# Patient Record
Sex: Female | Born: 1970
Health system: Southern US, Community
[De-identification: ages and names within clinical notes are randomized; demographics above are authoritative.]

## PROBLEM LIST (undated history)

## (undated) DIAGNOSIS — E785 Hyperlipidemia, unspecified: Secondary | ICD-10-CM

## (undated) DIAGNOSIS — N189 Chronic kidney disease, unspecified: Secondary | ICD-10-CM

## (undated) DIAGNOSIS — K219 Gastro-esophageal reflux disease without esophagitis: Secondary | ICD-10-CM

## (undated) DIAGNOSIS — Z9889 Other specified postprocedural states: Secondary | ICD-10-CM

## (undated) HISTORY — PX: KNEE ARTHROSCOPY: SUR90

## (undated) HISTORY — PX: KIDNEY STONE SURGERY: SHX686

## (undated) HISTORY — PX: CHOLECYSTECTOMY: SHX55

## (undated) HISTORY — PX: WRIST SURGERY: SHX841

---

## 2009-01-28 ENCOUNTER — Other Ambulatory Visit: Admission: RE | Admit: 2009-01-28 | Discharge: 2009-01-28 | Payer: Self-pay | Admitting: Obstetrics and Gynecology

## 2009-04-16 ENCOUNTER — Ambulatory Visit (HOSPITAL_COMMUNITY): Admission: RE | Admit: 2009-04-16 | Discharge: 2009-04-16 | Payer: Self-pay | Admitting: Obstetrics and Gynecology

## 2010-04-19 LAB — URINALYSIS, ROUTINE W REFLEX MICROSCOPIC
Bilirubin Urine: NEGATIVE
Ketones, ur: NEGATIVE mg/dL
Nitrite: NEGATIVE
Protein, ur: NEGATIVE mg/dL
Urobilinogen, UA: 0.2 mg/dL (ref 0.0–1.0)
pH: 5.5 (ref 5.0–8.0)

## 2010-04-19 LAB — CBC
MCHC: 33.3 g/dL (ref 30.0–36.0)
MCV: 83.8 fL (ref 78.0–100.0)
Platelets: 439 10*3/uL — ABNORMAL HIGH (ref 150–400)
RDW: 14.8 % (ref 11.5–15.5)

## 2010-04-19 LAB — BASIC METABOLIC PANEL
BUN: 7 mg/dL (ref 6–23)
CO2: 26 mEq/L (ref 19–32)
Chloride: 103 mEq/L (ref 96–112)
Creatinine, Ser: 0.72 mg/dL (ref 0.4–1.2)

## 2010-04-19 LAB — PREGNANCY, URINE: Preg Test, Ur: NEGATIVE

## 2010-11-16 ENCOUNTER — Other Ambulatory Visit: Payer: Self-pay | Admitting: Obstetrics and Gynecology

## 2010-11-16 ENCOUNTER — Other Ambulatory Visit (HOSPITAL_COMMUNITY)
Admission: RE | Admit: 2010-11-16 | Discharge: 2010-11-16 | Disposition: A | Discharge status: Home / Self Care | Payer: Federal, State, Local not specified - PPO | Source: Ambulatory Visit | Attending: Obstetrics and Gynecology | Admitting: Obstetrics and Gynecology

## 2010-11-16 DIAGNOSIS — Z01419 Encounter for gynecological examination (general) (routine) without abnormal findings: Secondary | ICD-10-CM | POA: Insufficient documentation

## 2010-11-18 ENCOUNTER — Other Ambulatory Visit: Payer: Self-pay | Admitting: Obstetrics and Gynecology

## 2010-11-18 DIAGNOSIS — Z1231 Encounter for screening mammogram for malignant neoplasm of breast: Secondary | ICD-10-CM

## 2010-12-10 ENCOUNTER — Ambulatory Visit: Payer: Self-pay

## 2010-12-29 ENCOUNTER — Ambulatory Visit
Admission: RE | Admit: 2010-12-29 | Discharge: 2010-12-29 | Disposition: A | Discharge status: Home / Self Care | Payer: Federal, State, Local not specified - PPO | Source: Ambulatory Visit | Attending: Obstetrics and Gynecology | Admitting: Obstetrics and Gynecology

## 2010-12-29 DIAGNOSIS — Z1231 Encounter for screening mammogram for malignant neoplasm of breast: Secondary | ICD-10-CM

## 2011-02-17 ENCOUNTER — Other Ambulatory Visit: Payer: Self-pay | Admitting: Obstetrics and Gynecology

## 2011-04-13 ENCOUNTER — Encounter (HOSPITAL_COMMUNITY): Payer: Self-pay | Admitting: Pharmacist

## 2011-04-14 ENCOUNTER — Encounter (HOSPITAL_COMMUNITY)
Admission: RE | Admit: 2011-04-14 | Discharge: 2011-04-14 | Disposition: A | Discharge status: Home / Self Care | Payer: Federal, State, Local not specified - PPO | Source: Ambulatory Visit | Attending: Obstetrics and Gynecology | Admitting: Obstetrics and Gynecology

## 2011-04-14 ENCOUNTER — Encounter (HOSPITAL_COMMUNITY): Payer: Self-pay

## 2011-04-14 HISTORY — DX: Chronic kidney disease, unspecified: N18.9

## 2011-04-14 HISTORY — DX: Gastro-esophageal reflux disease without esophagitis: K21.9

## 2011-04-14 HISTORY — DX: Hyperlipidemia, unspecified: E78.5

## 2011-04-14 LAB — CBC
Hemoglobin: 14.3 g/dL (ref 12.0–15.0)
MCH: 27.8 pg (ref 26.0–34.0)
RBC: 5.15 MIL/uL — ABNORMAL HIGH (ref 3.87–5.11)

## 2011-04-14 LAB — SURGICAL PCR SCREEN: MRSA, PCR: NEGATIVE

## 2011-04-14 NOTE — Patient Instructions (Addendum)
YOUR PROCEDURE IS SCHEDULED ON:04/21/11  ENTER THROUGH THE MAIN ENTRANCE OF [facility] AT:0930 am  USE DESK PHONE AND DIAL [phone] TO INFORM us OF YOUR ARRIVAL  CALL [phone] IF YOU HAVE ANY QUESTIONS OR PROBLEMS PRIOR TO YOUR ARRIVAL.  REMEMBER: DO NOT EAT OR DRINK AFTER MIDNIGHT : Wed.  SPECIAL INSTRUCTIONS:   YOU MAY BRUSH YOUR TEETH THE MORNING OF SURGERY   TAKE THESE MEDICINES THE DAY OF SURGERY WITH SIP OF WATER:   DO NOT WEAR JEWELRY, EYE MAKEUP, LIPSTICK OR DARK FINGERNAIL POLISH DO NOT WEAR LOTIONS  DO NOT SHAVE FOR 48 HOURS PRIOR TO SURGERY  YOU WILL NOT BE ALLOWED TO DRIVE YOURSELF HOME.  NAME OF DRIVER:Carlos- [phone]

## 2011-04-20 ENCOUNTER — Other Ambulatory Visit: Payer: Self-pay | Admitting: Obstetrics and Gynecology

## 2011-04-21 ENCOUNTER — Encounter (HOSPITAL_COMMUNITY): Payer: Self-pay | Admitting: *Deleted

## 2011-04-21 ENCOUNTER — Ambulatory Visit (HOSPITAL_COMMUNITY)
Admission: RE | Admit: 2011-04-21 | Discharge: 2011-04-21 | Disposition: A | Discharge status: Home / Self Care | Payer: Federal, State, Local not specified - PPO | Source: Ambulatory Visit | Attending: Obstetrics and Gynecology | Admitting: Obstetrics and Gynecology

## 2011-04-21 ENCOUNTER — Encounter (HOSPITAL_COMMUNITY): Payer: Self-pay | Admitting: Anesthesiology

## 2011-04-21 ENCOUNTER — Encounter (HOSPITAL_COMMUNITY)
Admission: RE | Disposition: A | Discharge status: Home / Self Care | Payer: Self-pay | Source: Ambulatory Visit | Attending: Obstetrics and Gynecology

## 2011-04-21 ENCOUNTER — Ambulatory Visit (HOSPITAL_COMMUNITY): Payer: Federal, State, Local not specified - PPO | Admitting: Anesthesiology

## 2011-04-21 DIAGNOSIS — Z302 Encounter for sterilization: Secondary | ICD-10-CM | POA: Insufficient documentation

## 2011-04-21 DIAGNOSIS — Z01818 Encounter for other preprocedural examination: Secondary | ICD-10-CM | POA: Insufficient documentation

## 2011-04-21 DIAGNOSIS — Z9851 Tubal ligation status: Secondary | ICD-10-CM

## 2011-04-21 DIAGNOSIS — Z01812 Encounter for preprocedural laboratory examination: Secondary | ICD-10-CM | POA: Insufficient documentation

## 2011-04-21 HISTORY — PX: LAPAROSCOPIC TUBAL LIGATION: SHX1937

## 2011-04-21 HISTORY — DX: Other specified postprocedural states: Z98.890

## 2011-04-21 SURGERY — LIGATION, FALLOPIAN TUBE, LAPAROSCOPIC
Anesthesia: General | Site: Abdomen | Laterality: Bilateral | Wound class: Clean

## 2011-04-21 MED ORDER — LIDOCAINE HCL (CARDIAC) 20 MG/ML IV SOLN
INTRAVENOUS | Status: AC
  Filled 2011-04-21: qty 5

## 2011-04-21 MED ORDER — DEXAMETHASONE SODIUM PHOSPHATE 4 MG/ML IJ SOLN
INTRAMUSCULAR | Status: DC | PRN
  Administered 2011-04-21: 10 mg via INTRAVENOUS

## 2011-04-21 MED ORDER — METOCLOPRAMIDE HCL 5 MG/ML IJ SOLN
INTRAMUSCULAR | Status: AC
  Administered 2011-04-21: 10 mg via INTRAVENOUS
  Filled 2011-04-21: qty 2

## 2011-04-21 MED ORDER — CIPROFLOXACIN IN D5W 400 MG/200ML IV SOLN
INTRAVENOUS | Status: DC | PRN
  Administered 2011-04-21: 400 mg via INTRAVENOUS

## 2011-04-21 MED ORDER — LACTATED RINGERS IV SOLN
INTRAVENOUS | Status: DC
  Administered 2011-04-21: 12:00:00 via INTRAVENOUS
  Administered 2011-04-21: 125 mL/h via INTRAVENOUS
  Administered 2011-04-21: 12:00:00 via INTRAVENOUS

## 2011-04-21 MED ORDER — BUPIVACAINE HCL (PF) 0.25 % IJ SOLN
INTRAMUSCULAR | Status: DC | PRN
  Administered 2011-04-21: 10 mL
  Administered 2011-04-21: 12 mL

## 2011-04-21 MED ORDER — SILVER NITRATE-POT NITRATE 75-25 % EX MISC
CUTANEOUS | Status: DC | PRN
  Administered 2011-04-21: 1

## 2011-04-21 MED ORDER — ONDANSETRON HCL 4 MG/2ML IJ SOLN
INTRAMUSCULAR | Status: DC | PRN
  Administered 2011-04-21: 4 mg via INTRAVENOUS

## 2011-04-21 MED ORDER — NEOSTIGMINE METHYLSULFATE 1 MG/ML IJ SOLN
INTRAMUSCULAR | Status: AC
  Filled 2011-04-21: qty 10

## 2011-04-21 MED ORDER — PROPOFOL 10 MG/ML IV EMUL
INTRAVENOUS | Status: DC | PRN
  Administered 2011-04-21: 150 mg via INTRAVENOUS

## 2011-04-21 MED ORDER — FENTANYL CITRATE 0.05 MG/ML IJ SOLN
INTRAMUSCULAR | Status: AC
  Filled 2011-04-21: qty 5

## 2011-04-21 MED ORDER — HYDROCODONE-ACETAMINOPHEN 5-500 MG PO TABS: 1.0000 | ORAL_TABLET | Freq: Four times a day (QID) | ORAL | Status: AC | PRN

## 2011-04-21 MED ORDER — FENTANYL CITRATE 0.05 MG/ML IJ SOLN: 25.0000 ug | INTRAMUSCULAR | Status: DC | PRN

## 2011-04-21 MED ORDER — ROCURONIUM BROMIDE 50 MG/5ML IV SOLN
INTRAVENOUS | Status: AC
  Filled 2011-04-21: qty 1

## 2011-04-21 MED ORDER — PROPOFOL 10 MG/ML IV EMUL
INTRAVENOUS | Status: AC
  Filled 2011-04-21: qty 20

## 2011-04-21 MED ORDER — GLYCOPYRROLATE 0.2 MG/ML IJ SOLN
INTRAMUSCULAR | Status: AC
  Filled 2011-04-21: qty 1

## 2011-04-21 MED ORDER — MUPIROCIN 2 % EX OINT
TOPICAL_OINTMENT | Freq: Two times a day (BID) | CUTANEOUS | Status: DC
  Administered 2011-04-21: 1 via NASAL

## 2011-04-21 MED ORDER — LIDOCAINE HCL (CARDIAC) 20 MG/ML IV SOLN
INTRAVENOUS | Status: DC | PRN
  Administered 2011-04-21: 80 mg via INTRAVENOUS

## 2011-04-21 MED ORDER — MUPIROCIN 2 % EX OINT
TOPICAL_OINTMENT | CUTANEOUS | Status: AC
  Administered 2011-04-21: 1 via NASAL
  Filled 2011-04-21: qty 22

## 2011-04-21 MED ORDER — CIPROFLOXACIN IN D5W 400 MG/200ML IV SOLN
400.0000 mg | INTRAVENOUS | Status: DC
  Filled 2011-04-21: qty 200

## 2011-04-21 MED ORDER — MIDAZOLAM HCL 5 MG/5ML IJ SOLN
INTRAMUSCULAR | Status: DC | PRN
  Administered 2011-04-21: 2 mg via INTRAVENOUS

## 2011-04-21 MED ORDER — KETOROLAC TROMETHAMINE 30 MG/ML IJ SOLN
INTRAMUSCULAR | Status: DC | PRN
  Administered 2011-04-21: 60 mg via INTRAVENOUS

## 2011-04-21 MED ORDER — MEPERIDINE HCL 25 MG/ML IJ SOLN: 6.2500 mg | INTRAMUSCULAR | Status: DC | PRN

## 2011-04-21 MED ORDER — NEOSTIGMINE METHYLSULFATE 1 MG/ML IJ SOLN
INTRAMUSCULAR | Status: DC | PRN
  Administered 2011-04-21: 2 mg via INTRAVENOUS

## 2011-04-21 MED ORDER — ONDANSETRON HCL 4 MG/2ML IJ SOLN
INTRAMUSCULAR | Status: AC
  Filled 2011-04-21: qty 2

## 2011-04-21 MED ORDER — CLINDAMYCIN PHOSPHATE 900 MG/50ML IV SOLN
900.0000 mg | INTRAVENOUS | Status: DC
  Filled 2011-04-21: qty 50

## 2011-04-21 MED ORDER — FENTANYL CITRATE 0.05 MG/ML IJ SOLN
INTRAMUSCULAR | Status: DC | PRN
  Administered 2011-04-21 (×3): 50 ug via INTRAVENOUS

## 2011-04-21 MED ORDER — MIDAZOLAM HCL 2 MG/2ML IJ SOLN
INTRAMUSCULAR | Status: AC
  Filled 2011-04-21: qty 2

## 2011-04-21 MED ORDER — KETOROLAC TROMETHAMINE 60 MG/2ML IM SOLN
INTRAMUSCULAR | Status: AC
  Filled 2011-04-21: qty 2

## 2011-04-21 MED ORDER — CLINDAMYCIN PHOSPHATE 600 MG/50ML IV SOLN
INTRAVENOUS | Status: DC | PRN
  Administered 2011-04-21: 900 mg via INTRAVENOUS

## 2011-04-21 MED ORDER — DEXAMETHASONE SODIUM PHOSPHATE 10 MG/ML IJ SOLN
INTRAMUSCULAR | Status: AC
  Filled 2011-04-21: qty 1

## 2011-04-21 MED ORDER — ROCURONIUM BROMIDE 100 MG/10ML IV SOLN
INTRAVENOUS | Status: DC | PRN
  Administered 2011-04-21: 20 mg via INTRAVENOUS
  Administered 2011-04-21: 5 mg via INTRAVENOUS

## 2011-04-21 MED ORDER — BUPIVACAINE HCL (PF) 0.25 % IJ SOLN
INTRAMUSCULAR | Status: AC
  Filled 2011-04-21: qty 30

## 2011-04-21 MED ORDER — GLYCOPYRROLATE 0.2 MG/ML IJ SOLN
INTRAMUSCULAR | Status: DC | PRN
  Administered 2011-04-21: 0.4 mg via INTRAVENOUS

## 2011-04-21 MED ORDER — METOCLOPRAMIDE HCL 5 MG/ML IJ SOLN
10.0000 mg | Freq: Once | INTRAMUSCULAR | Status: AC | PRN
  Administered 2011-04-21: 10 mg via INTRAVENOUS

## 2011-04-21 MED ORDER — IBUPROFEN 600 MG PO TABS: 600.0000 mg | ORAL_TABLET | Freq: Four times a day (QID) | ORAL | Status: AC | PRN

## 2011-04-21 SURGICAL SUPPLY — 25 items
APPLICATOR COTTON TIP 6IN STRL (MISCELLANEOUS) ×2 IMPLANT
CATH ROBINSON RED A/P 16FR (CATHETERS) ×2 IMPLANT
CLIP FILSHIE TUBAL LIGA STRL (Clip) ×2 IMPLANT
CLOTH BEACON ORANGE TIMEOUT ST (SAFETY) ×2 IMPLANT
DERMABOND ADVANCED (GAUZE/BANDAGES/DRESSINGS) ×1
DERMABOND ADVANCED .7 DNX12 (GAUZE/BANDAGES/DRESSINGS) ×1 IMPLANT
GLOVE BIOGEL M 6.5 STRL (GLOVE) ×4 IMPLANT
GLOVE BIOGEL PI IND STRL 6 (GLOVE) ×1 IMPLANT
GLOVE BIOGEL PI IND STRL 6.5 (GLOVE) ×2 IMPLANT
GLOVE BIOGEL PI INDICATOR 6 (GLOVE) ×1
GLOVE BIOGEL PI INDICATOR 6.5 (GLOVE) ×2
GLOVE ECLIPSE 7.0 STRL STRAW (GLOVE) ×2 IMPLANT
GLOVE INDICATOR 7.0 STRL GRN (GLOVE) ×2 IMPLANT
GLOVE INDICATOR 7.5 STRL GRN (GLOVE) ×2 IMPLANT
GLOVE NEODERM STER SZ 7 (GLOVE) ×2 IMPLANT
GLOVE SURG SS PI 6.0 STRL IVOR (GLOVE) ×2 IMPLANT
GOWN PREVENTION PLUS LG XLONG (DISPOSABLE) ×6 IMPLANT
GOWN PREVENTION PLUS XLARGE (GOWN DISPOSABLE) ×2 IMPLANT
PACK LAPAROSCOPY BASIN (CUSTOM PROCEDURE TRAY) ×2 IMPLANT
SUT VICRYL 0 UR6 27IN ABS (SUTURE) ×2 IMPLANT
SUT VICRYL 4-0 PS2 18IN ABS (SUTURE) ×2 IMPLANT
TOWEL OR 17X24 6PK STRL BLUE (TOWEL DISPOSABLE) ×4 IMPLANT
TROCAR XCEL NON-BLD 11X100MML (ENDOMECHANICALS) ×2 IMPLANT
WARMER LAPAROSCOPE (MISCELLANEOUS) ×2 IMPLANT
WATER STERILE IRR 1000ML POUR (IV SOLUTION) ×2 IMPLANT

## 2011-04-21 NOTE — Discharge Instructions (Signed)

## 2011-04-21 NOTE — Preoperative (Signed)
Beta Blockers   Reason not to administer Beta Blockers:Not Applicable 

## 2011-04-21 NOTE — Anesthesia Postprocedure Evaluation (Signed)
  Anesthesia Post-op Note  Patient: Shari Olson  Procedure(s) Performed: Procedure(s) (LRB): LAPAROSCOPIC TUBAL LIGATION (Bilateral)  Patient Location: PACU  Anesthesia Type: General  Level of Consciousness: awake, alert  and oriented  Airway and Oxygen Therapy: Patient Spontanous Breathing  Post-op Pain: none  Post-op Assessment: Post-op Vital signs reviewed, Patient's Cardiovascular Status Stable, Respiratory Function Stable, Patent Airway, No signs of Nausea or vomiting and Pain level controlled  Post-op Vital Signs: Reviewed and stable  Complications: No apparent anesthesia complications

## 2011-04-21 NOTE — Anesthesia Preprocedure Evaluation (Signed)
Anesthesia Evaluation  Patient identified by MRN, date of birth, ID band Patient awake    Reviewed: Allergy & Precautions, H&P , NPO status , Patient's Chart, lab work & pertinent test results  Airway Mallampati: III TM Distance: >3 FB Neck ROM: full    Dental No notable dental hx. (+) Teeth Intact   Pulmonary neg pulmonary ROS,  breath sounds clear to auscultation  Pulmonary exam normal       Cardiovascular negative cardio ROS  Rhythm:regular Rate:Normal     Neuro/Psych negative neurological ROS  negative psych ROS   GI/Hepatic Neg liver ROS, GERD-  Medicated,  Endo/Other  negative endocrine ROS  Renal/GU negative Renal ROS  negative genitourinary   Musculoskeletal   Abdominal Normal abdominal exam  (+)   Peds  Hematology negative hematology ROS (+)   Anesthesia Other Findings   Reproductive/Obstetrics negative OB ROS                           Anesthesia Physical Anesthesia Plan  ASA: II  Anesthesia Plan: General ETT   Post-op Pain Management:    Induction:   Airway Management Planned:   Additional Equipment:   Intra-op Plan:   Post-operative Plan:   Informed Consent: I have reviewed the patients History and Physical, chart, labs and discussed the procedure including the risks, benefits and alternatives for the proposed anesthesia with the patient or authorized representative who has indicated his/her understanding and acceptance.   Dental Advisory Given  Plan Discussed with: Anesthesiologist, CRNA and Surgeon  Anesthesia Plan Comments:         Anesthesia Quick Evaluation

## 2011-04-21 NOTE — Transfer of Care (Signed)
Immediate Anesthesia Transfer of Care Note  Patient: Shari Olson  Procedure(s) Performed: Procedure(s) (LRB): LAPAROSCOPIC TUBAL LIGATION (Bilateral)  Patient Location: PACU  Anesthesia Type: General  Level of Consciousness: awake, alert , oriented and patient cooperative  Airway & Oxygen Therapy: Patient Spontanous Breathing and Patient connected to nasal cannula oxygen  Post-op Assessment: Report given to PACU RN and Post -op Vital signs reviewed and stable  Post vital signs: Reviewed and stable  Complications: No apparent anesthesia complications

## 2011-04-21 NOTE — H&P (Signed)
41 yo G2P2 presents for laparoscopic bilateral tubal ligation. She desires permanent sterilization.   PMH ovarian cyst Vitamin D deficiency Cholesterolemia  PSH cholecystectomy lipotripsy x2 Right knee tear-laser surgery Left wrist surgery D&C hysteroscopy and polypectomy   Social denies tob/ etoh or illicit drug use  Family history noncontributory GYN no std's  Allergies penicillin  Meds simvastatin zantac mvi  ROS irregular menses otherwise negative except as stated in HPI   AF VSS CV rrr Lungs Clear Abd soft nontender +BS Ext no edema  A/P patient desires permanent sterilization... Plan for laparoscopic tubal ligation with filschie clips. R/b/a of surgery discussed with the patient including infection / bleeding damage to bowel bladder and surrounding organs with the need for further surgery. Pt advised that tubal ligation is 99 % effective and has a 1 % r/o failure with possible life threatenng  Ectopic if failure occurs. Pt voiced understanding and desires to proceed with laparoscopic tubaligation with fulgaration or filschie clips.

## 2011-04-21 NOTE — Op Note (Signed)
04/21/2011  11:51 AM  PATIENT:  Shari Olson  41 y.o. female  PRE-OPERATIVE DIAGNOSIS:  BTL  POST-OPERATIVE DIAGNOSIS:  BTL  PROCEDURE:  Procedure(s) (LRB): LAPAROSCOPIC TUBAL LIGATION (Bilateral) with filschie clips   SURGEON:  Surgeon(s) and Role:    * Joshuah Minella J. Richardson Dopp, MD - Primary  PHYSICIAN ASSISTANT: None  ASSISTANTS: none   ANESTHESIA:   general  EBL: 10 cc Total I/O In: 1000 [I.V.:1000] Out: 55 [Urine:50; Blood:5]  BLOOD ADMINISTERED:none  DRAINS: none   LOCAL MEDICATIONS USED:  MARCAINE     SPECIMEN:  No Specimen  DISPOSITION OF SPECIMEN:  PATHOLOGY no specimen  COUNTS:  YES  TOURNIQUET:  * No tourniquets in log *  DICTATION: .Dragon Dictation  PLAN OF CARE: Discharge to home after PACU  PATIENT DISPOSITION:  PACU - hemodynamically stable.    Procedure. Pt was taken to the OR where she was placed under general anesthesia. She was placed in the dorsal lithotomy position. She was prepped In and out catheterized and draped in a sterile fashion. A speculum was placed in the vaginal vault and the uterine manipulator was placed without difficulty.   Attention was turned to the abdomen. The umbilicus was injected with 10 cc of 0.25% marcaine. A 10 mm incision was made with the scapel. A 10 mm trocar was placed under direct visualization.  Pneumoperitoneum was achieved with CO 2 gas. Findings paratubal cyst.. Normal ovaries. .. The fallopian tubes were identifed bilaterally and followed to the fimbriated end. Using the operative laparoscope filschie clips were placed over the fallopian tubes bilaterally. 5 cc of 0.25 % marcaine was placed over the filschie clips.    The pneumoperitoneum was released . The 10 mm trocar was removed under  Direct visualization. The fascia incision was closed with 0 vicryl. The skin was closed with 4-0 vicryl and dermabond.  The uterine manipulater was removed. Pt had slight bleeding from the tenaculum slight. Silver nitrate was applied  and hemostasis was noted.  Sponge lap and needle counts were correct times 2.    EBL 10 cc  UOP 75 cc   Delay start of Pharmacological VTE agent (>24hrs) due to surgical blood loss or risk of bleeding: not applicable

## 2011-04-23 ENCOUNTER — Encounter (HOSPITAL_COMMUNITY): Payer: Self-pay | Admitting: Obstetrics and Gynecology

## 2011-11-30 ENCOUNTER — Other Ambulatory Visit: Payer: Self-pay | Admitting: Obstetrics and Gynecology

## 2011-11-30 ENCOUNTER — Other Ambulatory Visit (HOSPITAL_COMMUNITY)
Admission: RE | Admit: 2011-11-30 | Discharge: 2011-11-30 | Disposition: A | Discharge status: Home / Self Care | Payer: BC Managed Care – PPO | Source: Ambulatory Visit | Attending: Obstetrics and Gynecology | Admitting: Obstetrics and Gynecology

## 2011-11-30 DIAGNOSIS — Z01419 Encounter for gynecological examination (general) (routine) without abnormal findings: Secondary | ICD-10-CM | POA: Insufficient documentation

## 2013-03-20 ENCOUNTER — Other Ambulatory Visit: Payer: Self-pay | Admitting: Family Medicine

## 2013-03-20 DIAGNOSIS — R7989 Other specified abnormal findings of blood chemistry: Secondary | ICD-10-CM

## 2013-03-20 DIAGNOSIS — R945 Abnormal results of liver function studies: Principal | ICD-10-CM

## 2013-03-22 ENCOUNTER — Other Ambulatory Visit: Payer: Self-pay | Admitting: Obstetrics and Gynecology

## 2013-03-22 ENCOUNTER — Other Ambulatory Visit (HOSPITAL_COMMUNITY)
Admission: RE | Admit: 2013-03-22 | Discharge: 2013-03-22 | Disposition: A | Discharge status: Home / Self Care | Payer: BC Managed Care – PPO | Source: Ambulatory Visit | Attending: Obstetrics and Gynecology | Admitting: Obstetrics and Gynecology

## 2013-03-22 ENCOUNTER — Other Ambulatory Visit: Payer: Self-pay

## 2013-03-22 DIAGNOSIS — Z1231 Encounter for screening mammogram for malignant neoplasm of breast: Secondary | ICD-10-CM

## 2013-03-22 DIAGNOSIS — Z124 Encounter for screening for malignant neoplasm of cervix: Secondary | ICD-10-CM | POA: Insufficient documentation

## 2013-03-22 DIAGNOSIS — Z1151 Encounter for screening for human papillomavirus (HPV): Secondary | ICD-10-CM | POA: Insufficient documentation

## 2013-03-29 ENCOUNTER — Ambulatory Visit
Admission: RE | Admit: 2013-03-29 | Discharge: 2013-03-29 | Disposition: A | Discharge status: Home / Self Care | Payer: BC Managed Care – PPO | Source: Ambulatory Visit | Attending: Family Medicine | Admitting: Family Medicine

## 2013-03-29 DIAGNOSIS — R945 Abnormal results of liver function studies: Principal | ICD-10-CM

## 2013-03-29 DIAGNOSIS — R7989 Other specified abnormal findings of blood chemistry: Secondary | ICD-10-CM

## 2013-04-09 ENCOUNTER — Ambulatory Visit: Payer: BC Managed Care – PPO

## 2013-04-11 ENCOUNTER — Ambulatory Visit: Payer: BC Managed Care – PPO

## 2013-04-15 ENCOUNTER — Ambulatory Visit
Admission: RE | Admit: 2013-04-15 | Discharge: 2013-04-15 | Disposition: A | Discharge status: Home / Self Care | Payer: BC Managed Care – PPO | Source: Ambulatory Visit

## 2013-04-15 DIAGNOSIS — Z1231 Encounter for screening mammogram for malignant neoplasm of breast: Secondary | ICD-10-CM

## 2014-01-08 ENCOUNTER — Other Ambulatory Visit: Payer: Self-pay | Admitting: Family Medicine

## 2014-01-08 DIAGNOSIS — R1032 Left lower quadrant pain: Secondary | ICD-10-CM

## 2014-01-10 ENCOUNTER — Ambulatory Visit
Admission: RE | Admit: 2014-01-10 | Discharge: 2014-01-10 | Disposition: A | Discharge status: Home / Self Care | Payer: Federal, State, Local not specified - PPO | Source: Ambulatory Visit | Attending: Family Medicine | Admitting: Family Medicine

## 2014-01-10 DIAGNOSIS — R1032 Left lower quadrant pain: Secondary | ICD-10-CM

## 2014-01-21 ENCOUNTER — Other Ambulatory Visit: Payer: Self-pay | Admitting: Family Medicine

## 2014-01-21 DIAGNOSIS — R1032 Left lower quadrant pain: Secondary | ICD-10-CM

## 2014-01-23 ENCOUNTER — Other Ambulatory Visit: Payer: Federal, State, Local not specified - PPO

## 2014-01-29 ENCOUNTER — Other Ambulatory Visit: Payer: Federal, State, Local not specified - PPO

## 2014-01-30 ENCOUNTER — Ambulatory Visit
Admission: RE | Admit: 2014-01-30 | Discharge: 2014-01-30 | Disposition: A | Discharge status: Home / Self Care | Payer: Federal, State, Local not specified - PPO | Source: Ambulatory Visit | Attending: Family Medicine | Admitting: Family Medicine

## 2014-01-30 DIAGNOSIS — R1032 Left lower quadrant pain: Secondary | ICD-10-CM

## 2014-01-30 MED ORDER — IOHEXOL 300 MG/ML  SOLN
100.0000 mL | Freq: Once | INTRAMUSCULAR | Status: AC | PRN
  Administered 2014-01-30: 100 mL via INTRAVENOUS

## 2014-03-24 ENCOUNTER — Other Ambulatory Visit: Payer: Self-pay | Admitting: Obstetrics and Gynecology

## 2014-03-24 ENCOUNTER — Other Ambulatory Visit (HOSPITAL_COMMUNITY)
Admission: RE | Admit: 2014-03-24 | Discharge: 2014-03-24 | Disposition: A | Discharge status: Home / Self Care | Payer: Federal, State, Local not specified - PPO | Source: Ambulatory Visit | Attending: Obstetrics and Gynecology | Admitting: Obstetrics and Gynecology

## 2014-03-24 DIAGNOSIS — Z113 Encounter for screening for infections with a predominantly sexual mode of transmission: Secondary | ICD-10-CM | POA: Diagnosis present

## 2014-03-24 DIAGNOSIS — Z01419 Encounter for gynecological examination (general) (routine) without abnormal findings: Secondary | ICD-10-CM | POA: Diagnosis present

## 2014-03-27 LAB — CYTOLOGY - PAP

## 2014-05-27 ENCOUNTER — Other Ambulatory Visit: Payer: Self-pay

## 2014-05-27 DIAGNOSIS — Z1231 Encounter for screening mammogram for malignant neoplasm of breast: Secondary | ICD-10-CM

## 2014-06-04 ENCOUNTER — Ambulatory Visit
Admission: RE | Admit: 2014-06-04 | Discharge: 2014-06-04 | Disposition: A | Discharge status: Home / Self Care | Payer: Federal, State, Local not specified - PPO | Source: Ambulatory Visit

## 2014-06-04 ENCOUNTER — Ambulatory Visit: Payer: Federal, State, Local not specified - PPO

## 2014-06-04 DIAGNOSIS — Z1231 Encounter for screening mammogram for malignant neoplasm of breast: Secondary | ICD-10-CM

## 2015-02-05 ENCOUNTER — Encounter (HOSPITAL_BASED_OUTPATIENT_CLINIC_OR_DEPARTMENT_OTHER): Payer: Self-pay | Admitting: *Deleted

## 2015-02-05 ENCOUNTER — Emergency Department (HOSPITAL_BASED_OUTPATIENT_CLINIC_OR_DEPARTMENT_OTHER)
Admission: EM | Admit: 2015-02-05 | Discharge: 2015-02-05 | Disposition: A | Discharge status: Home / Self Care | Payer: Worker's Compensation | Attending: Emergency Medicine | Admitting: Emergency Medicine

## 2015-02-05 ENCOUNTER — Emergency Department (HOSPITAL_BASED_OUTPATIENT_CLINIC_OR_DEPARTMENT_OTHER): Payer: Worker's Compensation

## 2015-02-05 DIAGNOSIS — S161XXA Strain of muscle, fascia and tendon at neck level, initial encounter: Secondary | ICD-10-CM | POA: Diagnosis not present

## 2015-02-05 DIAGNOSIS — Z79899 Other long term (current) drug therapy: Secondary | ICD-10-CM | POA: Diagnosis not present

## 2015-02-05 DIAGNOSIS — Y92410 Unspecified street and highway as the place of occurrence of the external cause: Secondary | ICD-10-CM | POA: Diagnosis not present

## 2015-02-05 DIAGNOSIS — Y9389 Activity, other specified: Secondary | ICD-10-CM | POA: Insufficient documentation

## 2015-02-05 DIAGNOSIS — Z87442 Personal history of urinary calculi: Secondary | ICD-10-CM | POA: Diagnosis not present

## 2015-02-05 DIAGNOSIS — Y998 Other external cause status: Secondary | ICD-10-CM | POA: Diagnosis not present

## 2015-02-05 DIAGNOSIS — S199XXA Unspecified injury of neck, initial encounter: Secondary | ICD-10-CM | POA: Diagnosis present

## 2015-02-05 DIAGNOSIS — N189 Chronic kidney disease, unspecified: Secondary | ICD-10-CM | POA: Insufficient documentation

## 2015-02-05 DIAGNOSIS — E785 Hyperlipidemia, unspecified: Secondary | ICD-10-CM | POA: Diagnosis not present

## 2015-02-05 DIAGNOSIS — Z8719 Personal history of other diseases of the digestive system: Secondary | ICD-10-CM | POA: Diagnosis not present

## 2015-02-05 DIAGNOSIS — Z880 Allergy status to penicillin: Secondary | ICD-10-CM | POA: Insufficient documentation

## 2015-02-05 MED ORDER — IBUPROFEN 400 MG PO TABS
600.0000 mg | ORAL_TABLET | Freq: Once | ORAL | Status: AC
  Administered 2015-02-05: 600 mg via ORAL
  Filled 2015-02-05: qty 1

## 2015-02-05 MED ORDER — CYCLOBENZAPRINE HCL 5 MG PO TABS: 5.0000 mg | ORAL_TABLET | Freq: Three times a day (TID) | ORAL | Status: AC | PRN

## 2015-02-05 MED ORDER — CYCLOBENZAPRINE HCL 10 MG PO TABS
5.0000 mg | ORAL_TABLET | Freq: Once | ORAL | Status: AC
  Administered 2015-02-05: 5 mg via ORAL
  Filled 2015-02-05: qty 1

## 2015-02-05 NOTE — ED Notes (Signed)
Unable to find pt

## 2015-02-05 NOTE — ED Notes (Signed)
BAT completed first then UDS
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
## 2015-02-05 NOTE — Discharge Instructions (Signed)
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
## 2015-03-24 ENCOUNTER — Other Ambulatory Visit (HOSPITAL_COMMUNITY)
Admission: RE | Admit: 2015-03-24 | Discharge: 2015-03-24 | Disposition: A | Discharge status: Home / Self Care | Payer: Federal, State, Local not specified - PPO | Source: Ambulatory Visit | Attending: Obstetrics and Gynecology | Admitting: Obstetrics and Gynecology

## 2015-03-24 ENCOUNTER — Other Ambulatory Visit: Payer: Self-pay | Admitting: Obstetrics and Gynecology

## 2015-03-24 DIAGNOSIS — Z01419 Encounter for gynecological examination (general) (routine) without abnormal findings: Secondary | ICD-10-CM | POA: Insufficient documentation

## 2015-03-25 LAB — CYTOLOGY - PAP

## 2015-10-27 DIAGNOSIS — E872 Acidosis: Secondary | ICD-10-CM | POA: Diagnosis not present

## 2015-10-27 DIAGNOSIS — K760 Fatty (change of) liver, not elsewhere classified: Secondary | ICD-10-CM | POA: Diagnosis not present

## 2015-10-27 DIAGNOSIS — E611 Iron deficiency: Secondary | ICD-10-CM | POA: Diagnosis not present

## 2015-11-06 DIAGNOSIS — M5430 Sciatica, unspecified side: Secondary | ICD-10-CM | POA: Diagnosis not present

## 2015-11-26 DIAGNOSIS — K080 Exfoliation of teeth due to systemic causes: Secondary | ICD-10-CM | POA: Diagnosis not present

## 2015-12-23 DIAGNOSIS — E669 Obesity, unspecified: Secondary | ICD-10-CM | POA: Diagnosis not present

## 2015-12-23 DIAGNOSIS — E7800 Pure hypercholesterolemia, unspecified: Secondary | ICD-10-CM | POA: Diagnosis not present

## 2015-12-23 DIAGNOSIS — E559 Vitamin D deficiency, unspecified: Secondary | ICD-10-CM | POA: Diagnosis not present

## 2015-12-23 DIAGNOSIS — Z0000 Encounter for general adult medical examination without abnormal findings: Secondary | ICD-10-CM | POA: Diagnosis not present

## 2015-12-23 DIAGNOSIS — R740 Nonspecific elevation of levels of transaminase and lactic acid dehydrogenase [LDH]: Secondary | ICD-10-CM | POA: Diagnosis not present

## 2016-03-17 DIAGNOSIS — F4321 Adjustment disorder with depressed mood: Secondary | ICD-10-CM | POA: Diagnosis not present

## 2016-04-05 DIAGNOSIS — E782 Mixed hyperlipidemia: Secondary | ICD-10-CM | POA: Diagnosis not present

## 2016-05-03 ENCOUNTER — Other Ambulatory Visit: Payer: Self-pay | Admitting: Obstetrics and Gynecology

## 2016-05-03 ENCOUNTER — Other Ambulatory Visit (HOSPITAL_COMMUNITY)
Admission: RE | Admit: 2016-05-03 | Discharge: 2016-05-03 | Disposition: A | Discharge status: Home / Self Care | Payer: Federal, State, Local not specified - PPO | Source: Ambulatory Visit | Attending: Obstetrics and Gynecology | Admitting: Obstetrics and Gynecology

## 2016-05-03 DIAGNOSIS — Z01419 Encounter for gynecological examination (general) (routine) without abnormal findings: Secondary | ICD-10-CM | POA: Diagnosis not present

## 2016-05-03 DIAGNOSIS — Z1151 Encounter for screening for human papillomavirus (HPV): Secondary | ICD-10-CM | POA: Diagnosis not present

## 2016-05-03 DIAGNOSIS — Z1231 Encounter for screening mammogram for malignant neoplasm of breast: Secondary | ICD-10-CM

## 2016-05-05 LAB — CYTOLOGY - PAP
Diagnosis: NEGATIVE
HPV (WINDOPATH): NOT DETECTED

## 2016-05-10 DIAGNOSIS — K760 Fatty (change of) liver, not elsewhere classified: Secondary | ICD-10-CM | POA: Diagnosis not present

## 2016-05-23 ENCOUNTER — Ambulatory Visit
Admission: RE | Admit: 2016-05-23 | Discharge: 2016-05-23 | Disposition: A | Discharge status: Home / Self Care | Payer: Federal, State, Local not specified - PPO | Source: Ambulatory Visit | Attending: Obstetrics and Gynecology | Admitting: Obstetrics and Gynecology

## 2016-05-23 DIAGNOSIS — Z1231 Encounter for screening mammogram for malignant neoplasm of breast: Secondary | ICD-10-CM

## 2016-06-02 DIAGNOSIS — K080 Exfoliation of teeth due to systemic causes: Secondary | ICD-10-CM | POA: Diagnosis not present

## 2016-12-28 DIAGNOSIS — K080 Exfoliation of teeth due to systemic causes: Secondary | ICD-10-CM | POA: Diagnosis not present

## 2017-01-04 DIAGNOSIS — Z0000 Encounter for general adult medical examination without abnormal findings: Secondary | ICD-10-CM | POA: Diagnosis not present

## 2017-01-04 DIAGNOSIS — E669 Obesity, unspecified: Secondary | ICD-10-CM | POA: Diagnosis not present

## 2017-01-04 DIAGNOSIS — E7800 Pure hypercholesterolemia, unspecified: Secondary | ICD-10-CM | POA: Diagnosis not present

## 2017-01-04 DIAGNOSIS — E559 Vitamin D deficiency, unspecified: Secondary | ICD-10-CM | POA: Diagnosis not present

## 2017-01-04 DIAGNOSIS — Z6831 Body mass index (BMI) 31.0-31.9, adult: Secondary | ICD-10-CM | POA: Diagnosis not present

## 2017-03-22 DIAGNOSIS — E559 Vitamin D deficiency, unspecified: Secondary | ICD-10-CM | POA: Diagnosis not present

## 2017-05-08 ENCOUNTER — Other Ambulatory Visit (HOSPITAL_COMMUNITY): Payer: Self-pay | Admitting: Gastroenterology

## 2017-05-08 DIAGNOSIS — K760 Fatty (change of) liver, not elsewhere classified: Secondary | ICD-10-CM | POA: Diagnosis not present

## 2017-05-08 DIAGNOSIS — Z8379 Family history of other diseases of the digestive system: Secondary | ICD-10-CM | POA: Diagnosis not present

## 2017-05-09 DIAGNOSIS — Z01419 Encounter for gynecological examination (general) (routine) without abnormal findings: Secondary | ICD-10-CM | POA: Diagnosis not present

## 2017-05-09 DIAGNOSIS — E782 Mixed hyperlipidemia: Secondary | ICD-10-CM | POA: Diagnosis not present

## 2017-05-11 ENCOUNTER — Ambulatory Visit (HOSPITAL_COMMUNITY)
Admission: RE | Admit: 2017-05-11 | Discharge: 2017-05-11 | Disposition: A | Discharge status: Home / Self Care | Payer: Federal, State, Local not specified - PPO | Source: Ambulatory Visit | Attending: Gastroenterology | Admitting: Gastroenterology

## 2017-05-11 DIAGNOSIS — K760 Fatty (change of) liver, not elsewhere classified: Secondary | ICD-10-CM | POA: Diagnosis not present

## 2017-05-11 DIAGNOSIS — K769 Liver disease, unspecified: Secondary | ICD-10-CM | POA: Insufficient documentation

## 2018-05-23 DIAGNOSIS — Z0000 Encounter for general adult medical examination without abnormal findings: Secondary | ICD-10-CM | POA: Diagnosis not present

## 2018-07-16 DIAGNOSIS — K7581 Nonalcoholic steatohepatitis (NASH): Secondary | ICD-10-CM | POA: Diagnosis not present

## 2018-07-18 ENCOUNTER — Other Ambulatory Visit: Payer: Self-pay | Admitting: Nurse Practitioner

## 2018-07-18 DIAGNOSIS — K7469 Other cirrhosis of liver: Secondary | ICD-10-CM

## 2018-07-19 DIAGNOSIS — Z01419 Encounter for gynecological examination (general) (routine) without abnormal findings: Secondary | ICD-10-CM | POA: Diagnosis not present

## 2018-07-20 ENCOUNTER — Other Ambulatory Visit: Payer: Self-pay | Admitting: Obstetrics and Gynecology

## 2018-07-20 DIAGNOSIS — Z1231 Encounter for screening mammogram for malignant neoplasm of breast: Secondary | ICD-10-CM

## 2018-07-23 ENCOUNTER — Other Ambulatory Visit: Payer: Self-pay

## 2018-07-23 ENCOUNTER — Ambulatory Visit
Admission: RE | Admit: 2018-07-23 | Discharge: 2018-07-23 | Disposition: A | Discharge status: Home / Self Care | Payer: Federal, State, Local not specified - PPO | Source: Ambulatory Visit | Attending: Obstetrics and Gynecology | Admitting: Obstetrics and Gynecology

## 2018-07-23 DIAGNOSIS — Z1231 Encounter for screening mammogram for malignant neoplasm of breast: Secondary | ICD-10-CM

## 2018-08-03 ENCOUNTER — Ambulatory Visit
Admission: RE | Admit: 2018-08-03 | Discharge: 2018-08-03 | Disposition: A | Discharge status: Home / Self Care | Payer: Federal, State, Local not specified - PPO | Source: Ambulatory Visit | Attending: Nurse Practitioner | Admitting: Nurse Practitioner

## 2018-08-03 DIAGNOSIS — K7469 Other cirrhosis of liver: Secondary | ICD-10-CM

## 2018-08-03 DIAGNOSIS — Z9049 Acquired absence of other specified parts of digestive tract: Secondary | ICD-10-CM | POA: Diagnosis not present

## 2018-08-03 DIAGNOSIS — K743 Primary biliary cirrhosis: Secondary | ICD-10-CM | POA: Diagnosis not present

## 2019-01-04 ENCOUNTER — Other Ambulatory Visit: Payer: Self-pay | Admitting: Nurse Practitioner

## 2019-01-04 DIAGNOSIS — K7581 Nonalcoholic steatohepatitis (NASH): Secondary | ICD-10-CM | POA: Diagnosis not present

## 2019-01-04 DIAGNOSIS — K7469 Other cirrhosis of liver: Secondary | ICD-10-CM

## 2019-01-04 DIAGNOSIS — R768 Other specified abnormal immunological findings in serum: Secondary | ICD-10-CM | POA: Diagnosis not present

## 2019-01-21 ENCOUNTER — Other Ambulatory Visit: Payer: Federal, State, Local not specified - PPO

## 2019-01-23 ENCOUNTER — Ambulatory Visit
Admission: RE | Admit: 2019-01-23 | Discharge: 2019-01-23 | Disposition: A | Discharge status: Home / Self Care | Payer: Federal, State, Local not specified - PPO | Source: Ambulatory Visit | Attending: Nurse Practitioner | Admitting: Nurse Practitioner

## 2019-01-23 DIAGNOSIS — K7460 Unspecified cirrhosis of liver: Secondary | ICD-10-CM | POA: Diagnosis not present

## 2019-01-23 DIAGNOSIS — K7469 Other cirrhosis of liver: Secondary | ICD-10-CM

## 2019-05-29 DIAGNOSIS — Z0000 Encounter for general adult medical examination without abnormal findings: Secondary | ICD-10-CM | POA: Diagnosis not present

## 2019-05-29 DIAGNOSIS — E611 Iron deficiency: Secondary | ICD-10-CM | POA: Diagnosis not present

## 2019-05-29 DIAGNOSIS — R7301 Impaired fasting glucose: Secondary | ICD-10-CM | POA: Diagnosis not present

## 2019-05-29 DIAGNOSIS — E7800 Pure hypercholesterolemia, unspecified: Secondary | ICD-10-CM | POA: Diagnosis not present

## 2019-05-29 DIAGNOSIS — E559 Vitamin D deficiency, unspecified: Secondary | ICD-10-CM | POA: Diagnosis not present

## 2019-07-18 ENCOUNTER — Other Ambulatory Visit: Payer: Self-pay | Admitting: Nurse Practitioner

## 2019-07-18 DIAGNOSIS — R768 Other specified abnormal immunological findings in serum: Secondary | ICD-10-CM | POA: Diagnosis not present

## 2019-07-18 DIAGNOSIS — K7581 Nonalcoholic steatohepatitis (NASH): Secondary | ICD-10-CM | POA: Diagnosis not present

## 2019-07-18 DIAGNOSIS — K7469 Other cirrhosis of liver: Secondary | ICD-10-CM

## 2019-07-23 DIAGNOSIS — Z01419 Encounter for gynecological examination (general) (routine) without abnormal findings: Secondary | ICD-10-CM | POA: Diagnosis not present

## 2019-07-24 ENCOUNTER — Other Ambulatory Visit: Payer: Self-pay

## 2019-07-24 ENCOUNTER — Ambulatory Visit
Admission: RE | Admit: 2019-07-24 | Discharge: 2019-07-24 | Disposition: A | Discharge status: Home / Self Care | Payer: Federal, State, Local not specified - PPO | Source: Ambulatory Visit | Attending: Obstetrics and Gynecology | Admitting: Obstetrics and Gynecology

## 2019-07-24 ENCOUNTER — Ambulatory Visit
Admission: RE | Admit: 2019-07-24 | Discharge: 2019-07-24 | Disposition: A | Discharge status: Home / Self Care | Payer: Federal, State, Local not specified - PPO | Source: Ambulatory Visit | Attending: Nurse Practitioner | Admitting: Nurse Practitioner

## 2019-07-24 ENCOUNTER — Other Ambulatory Visit: Payer: Self-pay | Admitting: Obstetrics and Gynecology

## 2019-07-24 DIAGNOSIS — K7469 Other cirrhosis of liver: Secondary | ICD-10-CM

## 2019-07-24 DIAGNOSIS — Z1231 Encounter for screening mammogram for malignant neoplasm of breast: Secondary | ICD-10-CM

## 2019-07-24 DIAGNOSIS — K7460 Unspecified cirrhosis of liver: Secondary | ICD-10-CM | POA: Diagnosis not present

## 2019-07-24 DIAGNOSIS — N200 Calculus of kidney: Secondary | ICD-10-CM | POA: Diagnosis not present

## 2019-07-24 DIAGNOSIS — Q600 Renal agenesis, unilateral: Secondary | ICD-10-CM | POA: Diagnosis not present

## 2019-08-19 DIAGNOSIS — R768 Other specified abnormal immunological findings in serum: Secondary | ICD-10-CM | POA: Diagnosis not present

## 2019-08-19 DIAGNOSIS — Z23 Encounter for immunization: Secondary | ICD-10-CM | POA: Diagnosis not present

## 2019-08-19 DIAGNOSIS — K7581 Nonalcoholic steatohepatitis (NASH): Secondary | ICD-10-CM | POA: Diagnosis not present

## 2019-09-06 DIAGNOSIS — R7309 Other abnormal glucose: Secondary | ICD-10-CM | POA: Diagnosis not present

## 2019-11-24 DIAGNOSIS — N201 Calculus of ureter: Secondary | ICD-10-CM | POA: Diagnosis not present

## 2019-11-24 DIAGNOSIS — R10814 Left lower quadrant abdominal tenderness: Secondary | ICD-10-CM | POA: Diagnosis not present

## 2020-01-02 DIAGNOSIS — Z23 Encounter for immunization: Secondary | ICD-10-CM | POA: Diagnosis not present

## 2020-02-02 DIAGNOSIS — R059 Cough, unspecified: Secondary | ICD-10-CM | POA: Diagnosis not present

## 2020-02-02 DIAGNOSIS — Z20828 Contact with and (suspected) exposure to other viral communicable diseases: Secondary | ICD-10-CM | POA: Diagnosis not present

## 2020-02-02 DIAGNOSIS — J069 Acute upper respiratory infection, unspecified: Secondary | ICD-10-CM | POA: Diagnosis not present

## 2020-03-16 ENCOUNTER — Other Ambulatory Visit: Payer: Self-pay | Admitting: Nurse Practitioner

## 2020-03-16 DIAGNOSIS — K7581 Nonalcoholic steatohepatitis (NASH): Secondary | ICD-10-CM | POA: Diagnosis not present

## 2020-03-27 DIAGNOSIS — R450 Nervousness: Secondary | ICD-10-CM | POA: Diagnosis not present

## 2020-03-27 DIAGNOSIS — M542 Cervicalgia: Secondary | ICD-10-CM | POA: Diagnosis not present

## 2020-03-27 DIAGNOSIS — R0789 Other chest pain: Secondary | ICD-10-CM | POA: Diagnosis not present

## 2020-03-27 DIAGNOSIS — M25512 Pain in left shoulder: Secondary | ICD-10-CM | POA: Diagnosis not present

## 2020-03-27 DIAGNOSIS — R079 Chest pain, unspecified: Secondary | ICD-10-CM | POA: Diagnosis not present

## 2020-03-27 DIAGNOSIS — Z79899 Other long term (current) drug therapy: Secondary | ICD-10-CM | POA: Diagnosis not present

## 2020-03-27 DIAGNOSIS — E785 Hyperlipidemia, unspecified: Secondary | ICD-10-CM | POA: Diagnosis not present

## 2020-03-30 DIAGNOSIS — F411 Generalized anxiety disorder: Secondary | ICD-10-CM | POA: Diagnosis not present

## 2020-03-30 DIAGNOSIS — R079 Chest pain, unspecified: Secondary | ICD-10-CM | POA: Diagnosis not present

## 2020-04-01 ENCOUNTER — Other Ambulatory Visit: Payer: Federal, State, Local not specified - PPO

## 2020-04-01 DIAGNOSIS — K7400 Hepatic fibrosis, unspecified: Secondary | ICD-10-CM | POA: Diagnosis not present

## 2020-04-01 DIAGNOSIS — R194 Change in bowel habit: Secondary | ICD-10-CM | POA: Diagnosis not present

## 2020-04-01 DIAGNOSIS — K219 Gastro-esophageal reflux disease without esophagitis: Secondary | ICD-10-CM | POA: Diagnosis not present

## 2020-04-01 DIAGNOSIS — R079 Chest pain, unspecified: Secondary | ICD-10-CM | POA: Diagnosis not present

## 2020-04-07 ENCOUNTER — Other Ambulatory Visit: Payer: Federal, State, Local not specified - PPO
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
## 2020-04-13 ENCOUNTER — Encounter: Payer: Self-pay | Admitting: Cardiovascular Disease

## 2020-04-13 ENCOUNTER — Ambulatory Visit: Payer: Federal, State, Local not specified - PPO | Admitting: Cardiovascular Disease

## 2020-04-13 ENCOUNTER — Other Ambulatory Visit: Payer: Self-pay

## 2020-04-13 VITALS — BP 100/74 | HR 86 | Ht <= 58 in | Wt 161.0 lb

## 2020-04-13 DIAGNOSIS — E782 Mixed hyperlipidemia: Secondary | ICD-10-CM

## 2020-04-13 DIAGNOSIS — R079 Chest pain, unspecified: Secondary | ICD-10-CM

## 2020-04-13 NOTE — Addendum Note (Signed)
Addended by: Darene Lamer T on: 04/13/2020 04:52 PM   Modules accepted: Orders

## 2020-04-13 NOTE — Patient Instructions (Signed)
Medication Instructions:  The current medical regimen is effective;  continue present plan and medications.  *If you need a refill on your cardiac medications before your next appointment, please call your pharmacy*   Testing/Procedures: Your physician has requested that you have an exercise tolerance test, this is a screening tool to track your fitness level. This test evaluates the your exercise capacity by measuring cardiovascular response to exercise, the stress response is induced by exercise (exercise-treadmill).  Graded exercise test is also known as maximal exercise test or stress EKG test  . Please also follow instruction sheet given.  COVID TESTING NEEDED   Follow-Up: At [facility], you and your health needs are our priority.  As part of our continuing mission to provide you with exceptional heart care, we have created designated Provider Care Teams.  These Care Teams include your primary Cardiologist (physician) and Advanced Practice Providers (APPs -  Physician Assistants and Nurse Practitioners) who all work together to provide you with the care you need, when you need it.  We recommend signing up for the patient portal called "MyChart".  Sign up information is provided on this After Visit Summary.  MyChart is used to connect with patients for Virtual Visits (Telemedicine).  Patients are able to view lab/test results, encounter notes, upcoming appointments, etc.  Non-urgent messages can be sent to your provider as well.   To learn more about what you can do with MyChart, go to [email or web address].    Your next appointment:   As needed  The format for your next appointment:   In Person  Provider:   Lennie Odor, MD

## 2020-04-14 DIAGNOSIS — R0789 Other chest pain: Secondary | ICD-10-CM | POA: Diagnosis not present

## 2020-04-14 DIAGNOSIS — F411 Generalized anxiety disorder: Secondary | ICD-10-CM | POA: Diagnosis not present

## 2020-04-14 DIAGNOSIS — K7460 Unspecified cirrhosis of liver: Secondary | ICD-10-CM | POA: Diagnosis not present

## 2020-04-14 NOTE — Addendum Note (Signed)
Addended by: Sande Rives on: 04/14/2020 04:15 PM   Modules accepted: Orders

## 2020-04-17 ENCOUNTER — Other Ambulatory Visit (HOSPITAL_COMMUNITY): Payer: Federal, State, Local not specified - PPO

## 2020-04-17 ENCOUNTER — Telehealth (HOSPITAL_COMMUNITY): Payer: Self-pay | Admitting: *Deleted

## 2020-04-17 NOTE — Telephone Encounter (Signed)
Close encounter 

## 2020-04-20 ENCOUNTER — Ambulatory Visit
Admission: RE | Admit: 2020-04-20 | Discharge: 2020-04-20 | Disposition: A | Discharge status: Home / Self Care | Payer: Federal, State, Local not specified - PPO | Source: Ambulatory Visit | Attending: Nurse Practitioner | Admitting: Nurse Practitioner

## 2020-04-20 DIAGNOSIS — K7581 Nonalcoholic steatohepatitis (NASH): Secondary | ICD-10-CM

## 2020-04-20 DIAGNOSIS — K7689 Other specified diseases of liver: Secondary | ICD-10-CM | POA: Diagnosis not present

## 2020-04-21 ENCOUNTER — Ambulatory Visit (HOSPITAL_COMMUNITY)
Admission: RE | Admit: 2020-04-21 | Discharge: 2020-04-21 | Disposition: A | Discharge status: Home / Self Care | Payer: Federal, State, Local not specified - PPO | Source: Ambulatory Visit | Attending: Internal Medicine | Admitting: Internal Medicine

## 2020-04-21 ENCOUNTER — Other Ambulatory Visit: Payer: Self-pay

## 2020-04-21 DIAGNOSIS — R079 Chest pain, unspecified: Secondary | ICD-10-CM | POA: Diagnosis not present

## 2020-04-21 LAB — EXERCISE TOLERANCE TEST
Estimated workload: 11.6 METS
Exercise duration (min): 9 min
Exercise duration (sec): 56 s
MPHR: 171 {beats}/min
Peak HR: 169 {beats}/min
Percent HR: 98 %
Rest HR: 79 {beats}/min

## 2020-06-01 DIAGNOSIS — E611 Iron deficiency: Secondary | ICD-10-CM | POA: Diagnosis not present

## 2020-06-01 DIAGNOSIS — R7301 Impaired fasting glucose: Secondary | ICD-10-CM | POA: Diagnosis not present

## 2020-06-01 DIAGNOSIS — Z0000 Encounter for general adult medical examination without abnormal findings: Secondary | ICD-10-CM | POA: Diagnosis not present

## 2020-06-01 DIAGNOSIS — E559 Vitamin D deficiency, unspecified: Secondary | ICD-10-CM | POA: Diagnosis not present

## 2020-06-01 DIAGNOSIS — Z23 Encounter for immunization: Secondary | ICD-10-CM | POA: Diagnosis not present

## 2020-06-01 DIAGNOSIS — E7800 Pure hypercholesterolemia, unspecified: Secondary | ICD-10-CM | POA: Diagnosis not present

## 2020-06-04 DIAGNOSIS — R0789 Other chest pain: Secondary | ICD-10-CM | POA: Diagnosis not present

## 2020-06-04 DIAGNOSIS — K7460 Unspecified cirrhosis of liver: Secondary | ICD-10-CM | POA: Diagnosis not present

## 2020-06-04 DIAGNOSIS — K219 Gastro-esophageal reflux disease without esophagitis: Secondary | ICD-10-CM | POA: Diagnosis not present

## 2020-07-13 DIAGNOSIS — R1013 Epigastric pain: Secondary | ICD-10-CM | POA: Diagnosis not present

## 2020-07-13 DIAGNOSIS — K2930 Chronic superficial gastritis without bleeding: Secondary | ICD-10-CM | POA: Diagnosis not present

## 2020-07-13 DIAGNOSIS — K5730 Diverticulosis of large intestine without perforation or abscess without bleeding: Secondary | ICD-10-CM | POA: Diagnosis not present

## 2020-07-13 DIAGNOSIS — R0789 Other chest pain: Secondary | ICD-10-CM | POA: Diagnosis not present

## 2020-07-13 DIAGNOSIS — D124 Benign neoplasm of descending colon: Secondary | ICD-10-CM | POA: Diagnosis not present

## 2020-07-13 DIAGNOSIS — K648 Other hemorrhoids: Secondary | ICD-10-CM | POA: Diagnosis not present

## 2020-07-13 DIAGNOSIS — Z1211 Encounter for screening for malignant neoplasm of colon: Secondary | ICD-10-CM | POA: Diagnosis not present

## 2020-07-13 DIAGNOSIS — K644 Residual hemorrhoidal skin tags: Secondary | ICD-10-CM | POA: Diagnosis not present

## 2020-07-13 DIAGNOSIS — K219 Gastro-esophageal reflux disease without esophagitis: Secondary | ICD-10-CM | POA: Diagnosis not present

## 2020-07-13 DIAGNOSIS — R12 Heartburn: Secondary | ICD-10-CM | POA: Diagnosis not present

## 2020-07-13 DIAGNOSIS — D120 Benign neoplasm of cecum: Secondary | ICD-10-CM | POA: Diagnosis not present

## 2020-07-13 DIAGNOSIS — K317 Polyp of stomach and duodenum: Secondary | ICD-10-CM | POA: Diagnosis not present

## 2020-07-15 ENCOUNTER — Other Ambulatory Visit: Payer: Self-pay

## 2020-07-15 ENCOUNTER — Encounter: Payer: Self-pay | Admitting: Dietician

## 2020-07-15 ENCOUNTER — Encounter: Payer: Federal, State, Local not specified - PPO | Attending: Family Medicine | Admitting: Dietician

## 2020-07-15 VITALS — Ht 59.0 in | Wt 159.3 lb

## 2020-07-15 DIAGNOSIS — E119 Type 2 diabetes mellitus without complications: Secondary | ICD-10-CM

## 2020-07-15 NOTE — Progress Notes (Signed)
Medical Nutrition Therapy  Appointment Start time:  0930  Appointment End time:  1040  Primary concerns today: Diabetes  Referral diagnosis: E11.69 Type 2 diabetes mellitus with other specified complication Preferred learning style: No preference indicated Learning readiness: Contemplating   NUTRITION ASSESSMENT   Anthropometrics  Ht: 4'11" Wt: 159.3 lbs Body mass index is 32.17 kg/m.   Clinical Medical Hx: Fatty Liver, Vit D deficiency, iron deficiency anemia, HLD Medications: Simvastatin Labs: A1c - 6.8, TC - 216 (high), LDL - 142 (high), Vit D - 75.1 (low) Notable Signs/Symptoms: Central adiposity  Lifestyle & Dietary Hx Pt reports being bad with consistency.  This can be when it comes to taking medication, states they would get very busy with work. Pt previously worked for [employer], but is now between jobs. Pt wants to make lifestyle changes during this period of unemployment. Pt states they usually eat twice a day, has a big meal for either lunch or dinner.  Pt mealtime would be erratic when working and they are having trouble shaking that routine. Pt is currently drinking homemade vegetable juice with kale, celery, pineapple, ginger, cucumber. Pt reports avoiding rice and pasta, but will overconsume them once they have some. Pt reports not drinking much water, no more than a cup a day. Pt is currently checking blood sugar twice a day, usually before breakfast and after breakfast.  Pt is logging blood sugar readings on an app. FBG average ~118 PPBG range 140-200    Estimated daily fluid intake: 64 oz Supplements: Has Omega 3/6/9's, CoQ10, and Daily MV. Will start taking them again. 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  As evidenced by A1c of 6.8, skipping meals, lack of physical activity, and over consumption of energy dense foods..   NUTRITION INTERVENTION  Nutrition education (E-1) on the following topics:  Educated patient on the pathophysiology of diabetes. This includes why our bodies need circulating blood sugar, the relationship between insulin and blood sugar, and the results of insulin resistance and/or pancreatic insufficiency on the development of diabetes. Educated patient on factors that contribute to elevation of blood sugars, such as stress, illness, injury,and food choices. Discussed the role that physical activity plays in lowering blood sugar. Educate patient on the three main macronutrients. Protein, fats, and carbohydrates. Discussed how each of these macronutrients affect blood sugar levels, especially carbohydrate, and the importance of eating a consistent amount of carbohydrate throughout the day. Educated patient on carbohydrate counting, 15g of carbohydrate equals one carb choice. Advised patient on the importance of consistently checking their blood sugar, and recognizing how lifestyle and food choices affect those numbers.  Educated patient on the role of a high fat and high sugar diet on their fatty liver. Advised patient to stick to a low fat diet and choose unsaturated fats, preferably high in omega 3s. Educated patient on the role of physical activity aiding in weight loss and improving fatty liver.  Handouts Provided Include  Omega 3 Source List Nutrition Cart Manual Yellow meal plan card Balanced plate  Learning  Style & Readiness for Change Teaching method utilized: Visual & Auditory  Demonstrated degree of  understanding via: Teach Back  Barriers to learning/adherence to lifestyle change: dmi   Goals Established by Pt Take your omega 3 supplement and multivitamin at the same time every day. Take them at night before bed. Check your blood before and 2 hours after starting breakfast each day.  Look for your fasting number to be between 70-100, and your blood sugar to rise no mare than 40-60 points after your breakfast. Make a smoothie using the whole fruit and vegetable instead of just juicing them to get the fiber.  Add in some skim milk or protein powder to your smoothie. Increase your physical activity. Work to get 150 minutes a week, spread out over 4-5 days. Go for a short walk after dinner. Work towards eating three meals a day, about 5-6 hours apart! Consistency!! Begin to recognize carbohydrates in your food choices! Have 3 carb choices at each meal (45 g).  Begin to build your meals using the proportions of the Balanced Plate. First, select your carb choice(s) for the meal, and determine how much you should have to equal 3 carb choices (45 g). Next, select your source of protein to pair with your carb choice(s). Finally, complete the remaining half of your meal with a variety of non-starchy vegetables.   MONITORING & EVALUATION Dietary intake, weekly physical activity, blood sugar log, and meal consistency in 6 weeks.  Next Steps  Patient is to follow up with RDN.

## 2020-07-15 NOTE — Patient Instructions (Addendum)
Take your omega 3 supplement and multivitamin at the same time every day. Take them at night before bed.  Check your blood before and 2 hours after starting breakfast each day.  Look for your fasting number to be between 70-100, and your blood sugar to rise no mare than 40-60 points after your breakfast.  Make a smoothie using the whole fruit and vegetable instead of just juicing them to get the fiber.  Add in some skim milk or protein powder to your smoothie.  Increase your physical activity. Work to get 150 minutes a week, spread out over 4-5 days.  Work towards eating three meals a day, about 5-6 hours apart! Consistency  Begin to recognize carbohydrates in your food choices!  Have 3 carb choices at each meal (45 g).   Begin to build your meals using the proportions of the Balanced Plate. First, select your carb choice(s) for the meal, and determine how much you should have to equal 3 carb choices (45 g). Next, select your source of protein to pair with your carb choice(s). Finally, complete the remaining half of your meal with a variety of non-starchy vegetables.

## 2020-07-23 ENCOUNTER — Other Ambulatory Visit: Payer: Self-pay | Admitting: Obstetrics and Gynecology

## 2020-07-23 DIAGNOSIS — Z1231 Encounter for screening mammogram for malignant neoplasm of breast: Secondary | ICD-10-CM

## 2020-07-23 DIAGNOSIS — Z01419 Encounter for gynecological examination (general) (routine) without abnormal findings: Secondary | ICD-10-CM | POA: Diagnosis not present

## 2020-07-23 DIAGNOSIS — B977 Papillomavirus as the cause of diseases classified elsewhere: Secondary | ICD-10-CM | POA: Diagnosis not present

## 2020-07-31 ENCOUNTER — Other Ambulatory Visit: Payer: Self-pay

## 2020-07-31 ENCOUNTER — Ambulatory Visit
Admission: RE | Admit: 2020-07-31 | Discharge: 2020-07-31 | Disposition: A | Discharge status: Home / Self Care | Payer: Federal, State, Local not specified - PPO | Source: Ambulatory Visit | Attending: Obstetrics and Gynecology | Admitting: Obstetrics and Gynecology

## 2020-07-31 DIAGNOSIS — Z1231 Encounter for screening mammogram for malignant neoplasm of breast: Secondary | ICD-10-CM | POA: Diagnosis not present

## 2020-08-25 ENCOUNTER — Other Ambulatory Visit: Payer: Self-pay

## 2020-08-25 ENCOUNTER — Encounter: Payer: Federal, State, Local not specified - PPO | Attending: Family Medicine | Admitting: Dietician

## 2020-08-25 ENCOUNTER — Encounter: Payer: Self-pay | Admitting: Dietician

## 2020-08-25 VITALS — Ht 59.0 in | Wt 148.6 lb

## 2020-08-25 DIAGNOSIS — E119 Type 2 diabetes mellitus without complications: Secondary | ICD-10-CM

## 2020-08-25 DIAGNOSIS — E1169 Type 2 diabetes mellitus with other specified complication: Secondary | ICD-10-CM | POA: Diagnosis not present

## 2020-08-25 NOTE — Progress Notes (Signed)
Medical Nutrition Therapy  Appointment Start time:  [phone]  Appointment End time:  0920  Primary concerns today: Diabetes  Referral diagnosis: E11.69 Type 2 diabetes mellitus with other specified complication Preferred learning style: No preference indicated Learning readiness: Contemplating   NUTRITION ASSESSMENT   Anthropometrics  Ht: 4'11" Wt: 148.6 lbs Wt Change: -10.7 lbs Body mass index is 30.01 kg/m.   Clinical Medical Hx: Fatty Liver, Vit D deficiency, iron deficiency anemia, HLD Medications: Simvastatin Labs: A1c - 6.8, TC - 216 (high), LDL - 142 (high), Vit D - 11.9 (low) Notable Signs/Symptoms: Central adiposity  Lifestyle & Dietary Hx Pt reports vast improvement with consistency overall.   Pt reports being consistent with their medications now. Pt is also taking their vitamins consistently, was taking at night but woke up to GI distress so they are taking their supplements with lunch now. Pt reports getting a new job, will start next Monday and will work on the weekends Sat - Mon 12 hr days. Pt states they prefer long hours.  Pt  brought BG meter to appointment. Avg. BG is 117. Highest reading was 198, lowest reading was 91. Pt has been in range 100% of the time in the last 2 weeks. FBG ~ 90-100, CBG ~ 120 - 140. Pt reports checking their blood sugar has been helpful for them to control portion sizes, based on their reading after a meal. Pt reports not being able to do 3 meals a day, so they are doing 2 meals. Pt states they are intermittent fasting, eat between 1:00 pm -6:00 pm. No snacks between meals. Pt states that their new job will allow them to continue their lifestyle changes. Pt is working on increasing their physical activity, hitting ~6,000 steps 5/7 days.  Pt reports being so happy that their clothes are fitting differently now.    Estimated daily fluid intake: 64 oz Supplements: Has Omega 3, CoQ10, and Daily MV, Vitamin D3. Pt has started taking them  again. 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  As evidenced by A1c of 6.8, skipping meals, lack of physical activity, and over consumption of energy dense foods..   NUTRITION INTERVENTION  Nutrition education (E-1) on the following topics:  Educated patient on the pathophysiology of diabetes. This includes why our bodies need circulating blood sugar, the relationship between insulin and blood sugar, and the results of insulin resistance and/or pancreatic insufficiency on the development of diabetes. Educated patient on factors that contribute to elevation of blood sugars, such as stress, illness, injury,and food choices. Discussed the role that physical activity plays in lowering blood sugar. Educate patient on the three main macronutrients. Protein, fats, and carbohydrates. Discussed how each of these macronutrients affect blood sugar levels, especially carbohydrate, and the importance of eating a consistent amount of carbohydrate throughout the day. Educated patient on carbohydrate counting, 15g of carbohydrate equals one carb choice. Advised patient on the importance of consistently checking their blood sugar, and recognizing how lifestyle and food choices affect those numbers.  Educated patient on the role of  a high fat and high sugar diet on their fatty liver. Advised patient to stick to a low fat diet and choose unsaturated fats, preferably high in omega 3s. Educated patient on the role of physical activity  aiding in weight loss and improving fatty liver.  Handouts Provided Include  Omega 3 Source List Nutrition Cart Manual Yellow meal plan card Balanced plate  Learning Style & Readiness for Change Teaching method utilized: Visual & Auditory  Demonstrated degree of understanding via: Teach Back  Barriers to learning/adherence to lifestyle change: dmi   Goals Established by Pt Keep up the great work!!  Continue to check your blood sugar before and 2 hours after starting breakfast each day.  Look for your fasting number to be between 70-100, and your blood sugar to rise no mare than 40-60 points after your breakfast. Continue to prepare balanced meals of lean proteins, non starchy vegetables, and smaller portions of carbs. Keep up the great work increasing your physical activity! Once you get comfortable with 6,000 steps 5 days a week, try to work up to 8,000 steps. Continue to choose healthy fats like avocado oil, walnuts, flax seed, and chia seeds. Make sure the flax and chia are ground.   MONITORING & EVALUATION Dietary intake, weekly physical activity, blood sugar log, and meal consistency in 6 weeks.  Next Steps  Patient is to follow up with RDN.

## 2020-08-25 NOTE — Patient Instructions (Addendum)
Keep up the great work!!   Continue to check your blood sugar before and 2 hours after starting breakfast each day.  Look for your fasting number to be between 70-100, and your blood sugar to rise no mare than 40-60 points after your breakfast.  Continue to prepare balanced meals of lean proteins, non starchy vegetables, and smaller portions of carbs.  Keep up the great work increasing your physical activity! Once you get comfortable with 6,000 steps 5 days a week, try to work up to 8,000 steps.  Continue to choose healthy fats like avocado oil, walnuts, flax seed, and chia seeds. Make sure the flax and chia are ground.

## 2020-09-14 DIAGNOSIS — K7460 Unspecified cirrhosis of liver: Secondary | ICD-10-CM | POA: Diagnosis not present

## 2020-09-14 DIAGNOSIS — K7581 Nonalcoholic steatohepatitis (NASH): Secondary | ICD-10-CM | POA: Diagnosis not present

## 2020-09-14 DIAGNOSIS — K7469 Other cirrhosis of liver: Secondary | ICD-10-CM | POA: Diagnosis not present

## 2020-09-15 ENCOUNTER — Other Ambulatory Visit: Payer: Self-pay | Admitting: Nurse Practitioner

## 2020-09-15 DIAGNOSIS — K7581 Nonalcoholic steatohepatitis (NASH): Secondary | ICD-10-CM

## 2020-09-15 DIAGNOSIS — K7460 Unspecified cirrhosis of liver: Secondary | ICD-10-CM

## 2020-09-29 ENCOUNTER — Ambulatory Visit
Admission: RE | Admit: 2020-09-29 | Discharge: 2020-09-29 | Disposition: A | Discharge status: Home / Self Care | Payer: Federal, State, Local not specified - PPO | Source: Ambulatory Visit | Attending: Nurse Practitioner | Admitting: Nurse Practitioner

## 2020-09-29 ENCOUNTER — Other Ambulatory Visit: Payer: Self-pay

## 2020-09-29 DIAGNOSIS — K7460 Unspecified cirrhosis of liver: Secondary | ICD-10-CM

## 2020-09-29 DIAGNOSIS — K7581 Nonalcoholic steatohepatitis (NASH): Secondary | ICD-10-CM | POA: Diagnosis not present

## 2020-10-07 ENCOUNTER — Encounter: Payer: Federal, State, Local not specified - PPO | Attending: Family Medicine | Admitting: Dietician

## 2020-10-07 ENCOUNTER — Other Ambulatory Visit: Payer: Self-pay

## 2020-10-07 ENCOUNTER — Encounter: Payer: Self-pay | Admitting: Dietician

## 2020-10-07 VITALS — Ht 59.0 in | Wt 143.7 lb

## 2020-10-07 DIAGNOSIS — E119 Type 2 diabetes mellitus without complications: Secondary | ICD-10-CM | POA: Insufficient documentation

## 2020-10-07 NOTE — Patient Instructions (Addendum)
Keep up the great work being consistent! You are really impressing me with your continued consistency!  Remember your blood sugar goal of under 125 when you wake up!  Continue to do your meditation and relaxation sessions at least 2-3 times a week.  Take your walks each day at work to help get +6,000 steps each day.  Do NOT feel guilt over having a sweet treat every once in a while!

## 2020-10-07 NOTE — Progress Notes (Signed)
Medical Nutrition Therapy  Appointment Start time:  1530  Appointment End time:  1600  Primary concerns today: Diabetes  Referral diagnosis: E11.69 Type 2 diabetes mellitus with other specified complication Preferred learning style: No preference indicated Learning readiness: Contemplating   NUTRITION ASSESSMENT   Anthropometrics  Ht: 4'11" Wt: 143.7 lbs Wt Change: -15.7 lbs Body mass index is 29.02 kg/m.   Clinical Medical Hx: Fatty Liver, Vit D deficiency, iron deficiency anemia, HLD Medications: Simvastatin Labs: NEW (08/25/2020) A1c - 6.3, TC - 216 (high), LDL - 142 (high), Vit D - 09.8 (low) Notable Signs/Symptoms: Central adiposity   Lifestyle & Dietary Hx Pt is still showing vast improvement with consistency overall.   Pt has lost another 5 pounds and lowered their A1c from 6.8 to 6.3 since June. Pt reports their husband was diagnosed with diabetes as well, has joined them in changing their dietary habits, and has already lost weight. Pt has also included their sister in their dietary and lifestlye changes too. Pt started working about a month ago, and states that their stress levels are very low at this job. Schedule is Thurs 11-5:30, Fri/Sat 8-6:30, Sun 8-8:30.  Pt is fasting on a day or two on the weekends, but is still eating two meals a day most days pf the week. Pt is following a "keto" low carb diet. Pt has started replacing rice with cauliflower rice, and limiting bread, potatoes, and pasta as well. Pt is planning on walking 15-20 minutes during their lunch break. Pt states they have not been able to hit their goal of 6,000 steps as often. Pt has started looking into YouTube exercise videos. Pt brought BG meter to appointment, pt checks fasting and occasionally after a meal. Average overall blood sugar - 110    Estimated daily fluid intake: 64 oz Supplements: Has Omega 3, CoQ10, and Daily MV, Vitamin D3.  Sleep: Normal Stress / self-care: Normal, uses  meditation/relaxation 3 times a week Current average weekly physical activity: [place] twice a week for a long time, ADLs,    24-Hr Dietary Recall First Meal: Blueberries, pork chicharron, water Snack:  Second Meal: Beef stew w/ tomatoes, peppers, onions, cilantro. Cauliflower rice with avocado oil. Snack: Mango Third Meal:  Snack: Beverages: Green tea, water   NUTRITION DIAGNOSIS  NB-1.1 Food and nutrition-related knowledge deficit As related to diabetes.  As evidenced by A1c of 6.8, skipping meals, lack of physical activity, and over consumption of energy dense foods..   NUTRITION INTERVENTION  Nutrition education (E-1) on the following topics:  Educated patient on the pathophysiology of diabetes. This includes why our bodies need circulating blood sugar, the relationship between insulin and blood sugar, and the results of insulin resistance and/or pancreatic insufficiency on the development of diabetes. Educated patient on factors that contribute to elevation of blood sugars, such as stress, illness, injury,and food choices. Discussed the role that physical activity plays in lowering blood sugar. Educate patient on the three main macronutrients. Protein, fats, and carbohydrates. Discussed how each of these macronutrients affect blood sugar levels, especially carbohydrate, and the importance of eating a consistent amount of carbohydrate throughout the day. Educated patient on carbohydrate counting, 15g of carbohydrate equals one carb choice. Advised patient on the importance of consistently checking their blood sugar, and recognizing how lifestyle and food choices affect those numbers.  Educated patient on the role of a high fat and high sugar diet on their fatty liver. Advised patient to stick to a low fat diet and choose  unsaturated fats, preferably high in omega 3s. Educated patient on the role of physical activity aiding in weight loss and improving fatty liver.  Handouts Provided  Include  Omega 3 Source List Nutrition Cart Manual Yellow meal plan card Balanced plate  Learning Style & Readiness for Change Teaching method utilized: Visual & Auditory  Demonstrated degree of understanding via: Teach Back  Barriers to learning/adherence to lifestyle change: None, change in progress   Goals Established by Pt Keep up the great work being consistent! You are really impressing me with your continued consistency! Remember your blood sugar goal of under 125 when you wake up! Continue to do your meditation and relaxation sessions at least 2-3 times a week. Take your walks each day at work to help get +6,000 steps each day. Do NOT feel guilt over having a sweet treat every once in a while!   MONITORING & EVALUATION Dietary intake, weekly physical activity, blood sugar log, and meal consistency in 2 months.  Next Steps  Patient is to follow up with RDN.

## 2020-12-08 ENCOUNTER — Ambulatory Visit: Payer: Federal, State, Local not specified - PPO | Admitting: Dietician

## 2021-01-05 DIAGNOSIS — Z23 Encounter for immunization: Secondary | ICD-10-CM | POA: Diagnosis not present

## 2021-01-05 DIAGNOSIS — E782 Mixed hyperlipidemia: Secondary | ICD-10-CM | POA: Diagnosis not present

## 2021-01-05 DIAGNOSIS — E1169 Type 2 diabetes mellitus with other specified complication: Secondary | ICD-10-CM | POA: Diagnosis not present

## 2021-01-12 ENCOUNTER — Encounter: Payer: Self-pay | Admitting: Dietician

## 2021-01-12 ENCOUNTER — Encounter: Payer: Federal, State, Local not specified - PPO | Attending: Family Medicine | Admitting: Dietician

## 2021-01-12 ENCOUNTER — Other Ambulatory Visit: Payer: Self-pay

## 2021-01-12 VITALS — Ht 59.0 in | Wt 138.8 lb

## 2021-01-12 DIAGNOSIS — E119 Type 2 diabetes mellitus without complications: Secondary | ICD-10-CM | POA: Insufficient documentation

## 2021-01-12 NOTE — Patient Instructions (Addendum)
Keep up the great work! You are showing an incredible amount of consistency!! Congrats on all your success!!  When having coffee, stick with half and half instead of heavy cream.  When having mortadella and pastrami, try to have 1 or 2 less slices on your sandwich.

## 2021-01-12 NOTE — Progress Notes (Signed)
Medical Nutrition Therapy  Appointment Start time:  [phone]  Appointment End time:  1640  Primary concerns today: Diabetes  Referral diagnosis: E11.69 Type 2 diabetes mellitus with other specified complication Preferred learning style: No preference indicated Learning readiness: Change in progress   NUTRITION ASSESSMENT   Anthropometrics  Ht: 4'11" Wt: 138.8 lbs Wt Change: -21 lbs Body mass index is 28.03 kg/m.   Clinical Medical Hx: Fatty Liver, Vit D deficiency, iron deficiency anemia, HLD Medications: Simvastatin Labs: NEW (01/05/2021) A1c - 5.9, TC - 189 (In range), LDL - 116 (Improved)  Vit D - 16.1 (low) Notable Signs/Symptoms: Central adiposity   Lifestyle & Dietary Hx Pt is still showing vast improvement with consistency overall.   Pt A1c is down to 5.9 from 6.3, lipid panel has improved as well. Pt brought their meter to appointment, pt is checking fasting glucose daily. 90 day average is down to 107. Pt has lost 5 more pounds since September, pt reports a goal weight of 130 lbs.  Pt reports feeling relief when getting their most recent labs, pt is very happy with their progress. Pt reports their husband and sister are both joining them in their weight loss journey, pt loves being a positive influence on them. Pt is not feeling guilty about having sugar occasionally, pt will increase activity level on days they consume more carbs. Pt is still doing "keto" and reports fasting for 36 hours once a month. Pt reports feeling more comfortable drinking water now, no nausea. Pt is consuming more green vegetables. Pt reports being very busy at work, is not walking during lunch as much as they want to.     Estimated daily fluid intake: 64 oz Supplements: Has Omega 3, CoQ10, and Daily MV, Vitamin D3.  Sleep: Normal Stress / self-care: Normal, uses meditation/relaxation 3 times a week Current average weekly physical activity: [place] twice a week for a long time, ADLs,     24-Hr Dietary Recall First Meal: Ceasar salad Snack:  Second Meal: Mortadella sandwich,  Snack:  Third Meal:  Snack: Beverages: Green tea, water   NUTRITION DIAGNOSIS  NB-1.1 Food and nutrition-related knowledge deficit As related to diabetes.  As evidenced by A1c of 6.8, skipping meals, lack of physical activity, and over consumption of energy dense foods..   NUTRITION INTERVENTION  Nutrition education (E-1) on the following topics:  Educated patient on the pathophysiology of diabetes. This includes why our bodies need circulating blood sugar, the relationship between insulin and blood sugar, and the results of insulin resistance and/or pancreatic insufficiency on the development of diabetes. Educated patient on factors that contribute to elevation of blood sugars, such as stress, illness, injury,and food choices. Discussed the role that physical activity plays in lowering blood sugar. Educate patient on the three main macronutrients. Protein, fats, and carbohydrates. Discussed how each of these macronutrients affect blood sugar levels, especially carbohydrate, and the importance of eating a consistent amount of carbohydrate throughout the day. Educated patient on carbohydrate counting, 15g of carbohydrate equals one carb choice. Advised patient on the importance of consistently checking their blood sugar, and recognizing how lifestyle and food choices affect those numbers.  Educated patient on the role of a high fat and high sugar diet on their fatty liver. Advised patient to stick to a low fat diet and choose unsaturated fats, preferably high in omega 3s. Educated patient on the role of physical activity aiding in weight loss and improving fatty liver.  Handouts Provided Include  Omega 3 Source  List Nutrition Cart Manual Yellow meal plan card Balanced plate  Learning Style & Readiness for Change Teaching method utilized: Visual & Auditory  Demonstrated degree of understanding via:  Teach Back  Barriers to learning/adherence to lifestyle change: None, change in progress   Goals Established by Pt Keep up the great work being consistent! You are really impressing me with your cKeep up the great work! You are showing an incredible amount of consistency!! Congrats on all your success!! When having coffee, stick with half and half instead of heavy cream. When having mortadella and pastrami, try to have 1 or 2 less slices on your sandwich.   MONITORING & EVALUATION Dietary intake, weekly physical activity, blood sugar log, and meal consistency in 3 months.  Next Steps  Patient is to follow up with RDN.

## 2021-01-26 DIAGNOSIS — U071 COVID-19: Secondary | ICD-10-CM | POA: Diagnosis not present

## 2021-03-24 ENCOUNTER — Other Ambulatory Visit: Payer: Self-pay | Admitting: Nurse Practitioner

## 2021-03-24 DIAGNOSIS — K7460 Unspecified cirrhosis of liver: Secondary | ICD-10-CM

## 2021-03-24 DIAGNOSIS — K7581 Nonalcoholic steatohepatitis (NASH): Secondary | ICD-10-CM

## 2021-04-06 ENCOUNTER — Encounter: Payer: Federal, State, Local not specified - PPO | Attending: Family Medicine | Admitting: Dietician

## 2021-04-06 ENCOUNTER — Other Ambulatory Visit: Payer: Self-pay

## 2021-04-06 ENCOUNTER — Encounter: Payer: Self-pay | Admitting: Dietician

## 2021-04-06 VITALS — Ht 59.0 in | Wt 138.2 lb

## 2021-04-06 DIAGNOSIS — E119 Type 2 diabetes mellitus without complications: Secondary | ICD-10-CM | POA: Diagnosis not present

## 2021-04-06 NOTE — Progress Notes (Signed)
Medical Nutrition Therapy  ?Appointment Start time:  0805  Appointment End time:  0840 ? ?Primary concerns today: Diabetes  ?Referral diagnosis: E11.69 Type 2 diabetes mellitus with other specified complication ?Preferred learning style: No preference indicated ?Learning readiness: Change in progress ? ? ?NUTRITION ASSESSMENT  ? ?Anthropometrics  ?Ht: 4'11" ?Wt: 138.2 lbs ?Wt Change: -21.6 lbs ?Body mass index is 27.91 kg/m?. ? ? ?Clinical ?Medical Hx: Fatty Liver, Vit D deficiency, iron deficiency anemia, HLD, Elevated D-Dimer ?Medications: Simvastatin (Discontinued) ?Labs: A1c - 5.9, TC - 189 (In range), LDL - 116 (Improved)  ?Vit D - 16.1 (low)  AST - 14, ALT - 14  ?Notable Signs/Symptoms: Central adiposity ? ? ?Lifestyle & Dietary Hx ?Pt is still showing vast improvement with consistency overall.   ?Pt brought their meter to appointment, pt is checking fasting glucose daily. Pt reports their FBG ?90 day average is 109. ?Pt reports seeing more 80's and 90's when checking their BG. ?Pt reports maintaining their body weight, has a goal weight of 130. Pt reports their husband and sisters are also continuing to lose weight. Pt sister has been staying with them and encouraged them to join the gym with her. Pt reports going to the gym once last week (walking, resistance), and plans on going 2-3 times a week in the future. Pt has a daily step goal of 5,000 - 6,000, 10,000 steps on days they go to the gym. ?Pt is concerned that they will go the gym for a little bit of time and end up not going due to their history of inconsistency. ?Pt reports still being successful adhering to keto and doing occasional fasting. Pt reports usually not eating during the work day, usually eating once or twice a day now. Pt is drinking water with apple cider vinegar regularly. ?Pt reports forgetting to take their supplements recently.  ? ?  ? ?Estimated daily fluid intake: 64 oz ?Supplements: Has Omega 3, CoQ10, and Daily MV, Vitamin D3.   ?Sleep: Normal ?Stress / self-care: Normal, uses meditation/relaxation 3 times a week ?Current average weekly physical activity: [place] twice a week for a long time, ADLs,  ? ? ?24-Hr Dietary Recall ?First Meal: 3 eggs, 5 strips bacon, coffee w/ whipped cream ?Snack:  ?Second Meal: No Lunch (very busy) ?Snack:  ?Third Meal: Popcorn, pastrami, baked chicken, mixed vegetables, water w/ apple cider vinegar ?Snack: Mixed berries w/ kefir ?Beverages: coffee, water ? ? ?NUTRITION DIAGNOSIS  ?NB-1.1 Food and nutrition-related knowledge deficit As related to diabetes.  As evidenced by A1c of 6.8, skipping meals, lack of physical activity, and over consumption of energy dense foods.. ? ? ?NUTRITION INTERVENTION  ?Nutrition education (E-1) on the following topics:  ?Educated patient on the pathophysiology of diabetes. This includes why our bodies need circulating blood sugar, the relationship between insulin and blood sugar, and the results of insulin resistance and/or pancreatic insufficiency on the development of diabetes. Educated patient on factors that contribute to elevation of blood sugars, such as stress, illness, injury,and food choices. Discussed the role that physical activity plays in lowering blood sugar. Educate patient on the three main macronutrients. Protein, fats, and carbohydrates. Discussed how each of these macronutrients affect blood sugar levels, especially carbohydrate, and the importance of eating a consistent amount of carbohydrate throughout the day. Educated patient on carbohydrate counting, 15g of carbohydrate equals one carb choice. Advised patient on the importance of consistently checking their blood sugar, and recognizing how lifestyle and food choices affect those numbers.  ?Educated  patient on the role of a high fat and high sugar diet on their fatty liver. Advised patient to stick to a low fat diet and choose unsaturated fats, preferably high in omega 3s. Educated patient on the role of  physical activity aiding in weight loss and improving fatty liver. ? ?Handouts Provided Include  ?Omega 3 Source List Nutrition Cart Manual ?Yellow meal plan card ?Balanced plate ? ?Learning Style & Readiness for Change ?Teaching method utilized: Visual & Auditory  ?Demonstrated degree of understanding via: Teach Back  ?Barriers to learning/adherence to lifestyle change: None, change in progress ? ? ?Goals Established by Pt ?Great work! Continue to believe in yourself! You have done amazing things already and been very consistent. ?Go to your gym 2-3 times and try to do some light resistance exercise 1-2 times each week. Try to hit 10,000 steps on the days that you go to the gym! ?Be sure to take your vitamin D supplements daily again. Take 1,000 IUs with each meal! ?Look for weight loss of around 1 pound a week! ? ? ?MONITORING & EVALUATION ?Dietary intake, weekly physical activity, blood sugar log, and meal consistency in 3 months. ? ?Next Steps  ?Patient is to follow up with RDN. ? ? ? ?

## 2021-04-06 NOTE — Patient Instructions (Addendum)
Great work! Continue to believe in yourself! You have done amazing things already and been very consistent. ? ?Go to your gym 2-3 times and try to do some light resistance exercise 1-2 times each week. Try to hit 10,000 steps on the days that you go to the gym! ? ?Be sure to take your vitamin D supplements daily again. Take 1,000 IUs with each meal! ? ?Look for weight loss of around 1 pound a week! ?

## 2021-04-12 ENCOUNTER — Ambulatory Visit
Admission: RE | Admit: 2021-04-12 | Discharge: 2021-04-12 | Disposition: A | Discharge status: Home / Self Care | Payer: Federal, State, Local not specified - PPO | Source: Ambulatory Visit | Attending: Nurse Practitioner | Admitting: Nurse Practitioner

## 2021-04-12 DIAGNOSIS — K7460 Unspecified cirrhosis of liver: Secondary | ICD-10-CM | POA: Diagnosis not present

## 2021-06-09 DIAGNOSIS — E782 Mixed hyperlipidemia: Secondary | ICD-10-CM | POA: Diagnosis not present

## 2021-06-09 DIAGNOSIS — E1169 Type 2 diabetes mellitus with other specified complication: Secondary | ICD-10-CM | POA: Diagnosis not present

## 2021-06-09 DIAGNOSIS — E611 Iron deficiency: Secondary | ICD-10-CM | POA: Diagnosis not present

## 2021-06-09 DIAGNOSIS — Z0000 Encounter for general adult medical examination without abnormal findings: Secondary | ICD-10-CM | POA: Diagnosis not present

## 2021-06-09 DIAGNOSIS — E559 Vitamin D deficiency, unspecified: Secondary | ICD-10-CM | POA: Diagnosis not present

## 2021-06-29 ENCOUNTER — Ambulatory Visit: Payer: Federal, State, Local not specified - PPO | Admitting: Dietician

## 2021-08-16 ENCOUNTER — Ambulatory Visit: Payer: Federal, State, Local not specified - PPO | Admitting: Registered"

## 2021-09-14 ENCOUNTER — Other Ambulatory Visit: Payer: Self-pay | Admitting: Obstetrics and Gynecology

## 2021-09-14 DIAGNOSIS — Z01419 Encounter for gynecological examination (general) (routine) without abnormal findings: Secondary | ICD-10-CM | POA: Diagnosis not present

## 2021-09-14 DIAGNOSIS — E782 Mixed hyperlipidemia: Secondary | ICD-10-CM | POA: Diagnosis not present

## 2021-09-14 DIAGNOSIS — Z1231 Encounter for screening mammogram for malignant neoplasm of breast: Secondary | ICD-10-CM

## 2021-10-05 ENCOUNTER — Ambulatory Visit
Admission: RE | Admit: 2021-10-05 | Discharge: 2021-10-05 | Disposition: A | Discharge status: Home / Self Care | Payer: Federal, State, Local not specified - PPO | Source: Ambulatory Visit | Attending: Obstetrics and Gynecology | Admitting: Obstetrics and Gynecology

## 2021-10-05 DIAGNOSIS — Z1231 Encounter for screening mammogram for malignant neoplasm of breast: Secondary | ICD-10-CM | POA: Diagnosis not present

## 2021-10-27 IMAGING — MG MM DIGITAL SCREENING BILAT W/ TOMO AND CAD
6 of 10 series · 6 of 30 positions shown · non-contrast
Comparison: Previous exam(s).

CLINICAL DATA: Screening.

EXAM:
DIGITAL SCREENING BILATERAL MAMMOGRAM WITH TOMOSYNTHESIS AND CAD
TECHNIQUE: Bilateral screening digital craniocaudal and mediolateral oblique
mammograms were obtained. Bilateral screening digital breast
tomosynthesis was performed. The images were evaluated with
computer-aided detection.

[R CC synth-2D (1 of 2)]
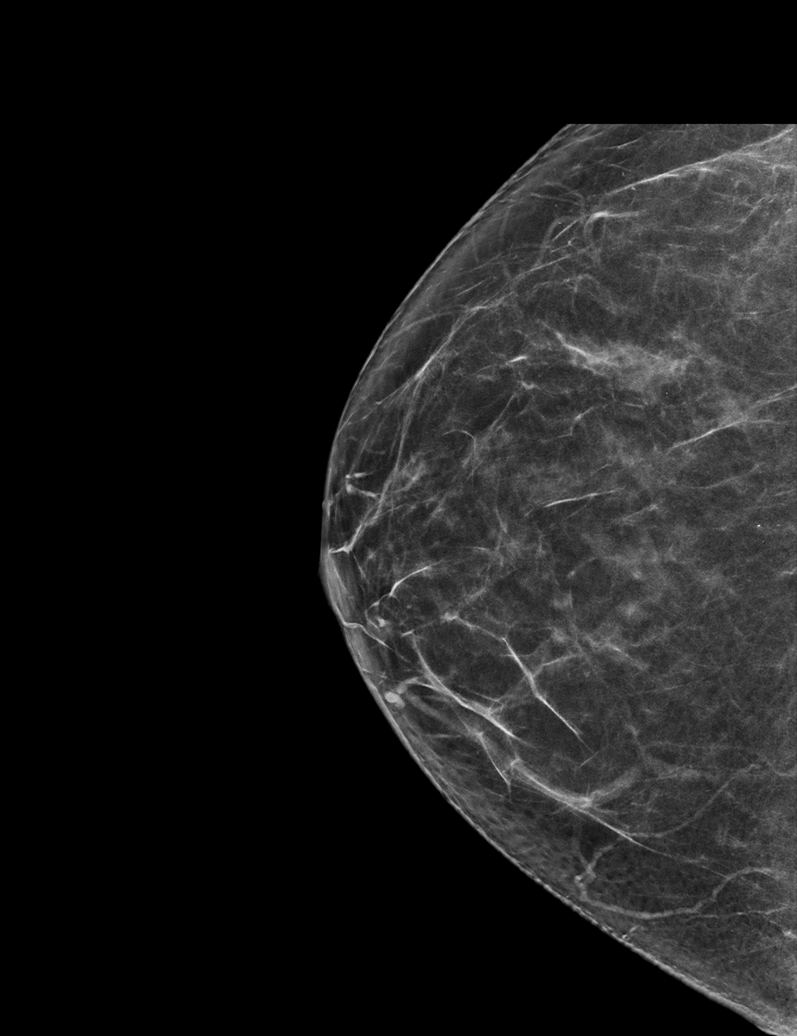

[L CC synth-2D]
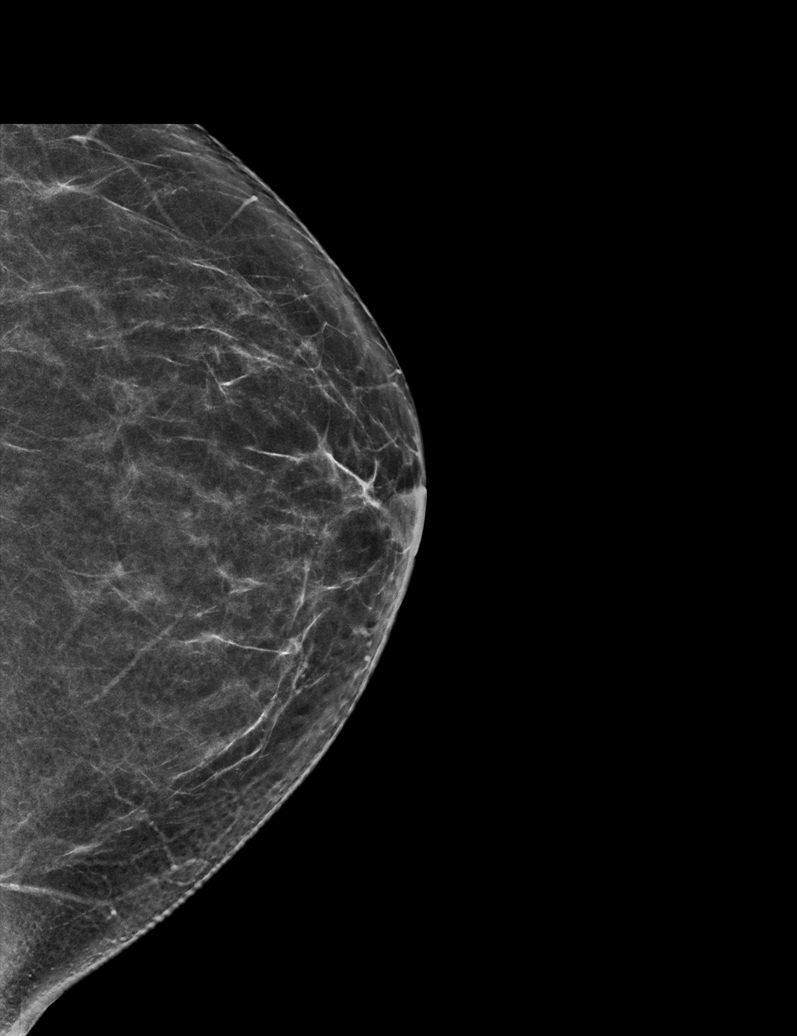

[L MLO synth-2D]
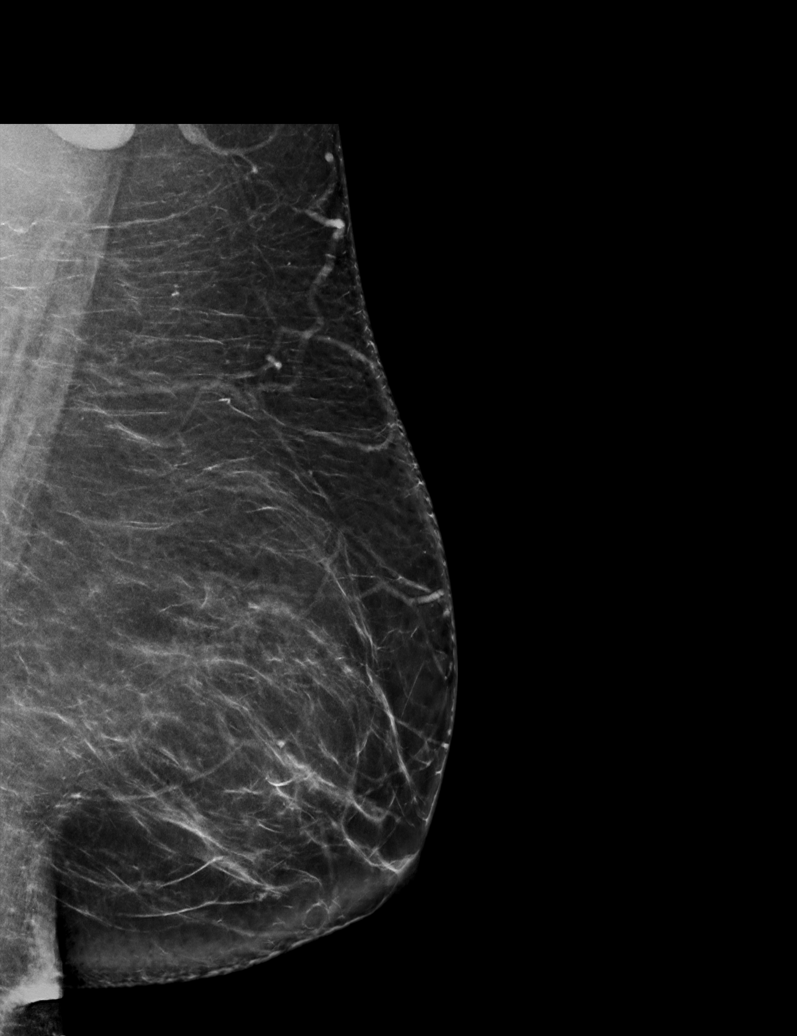

[R MLO synth-2D]
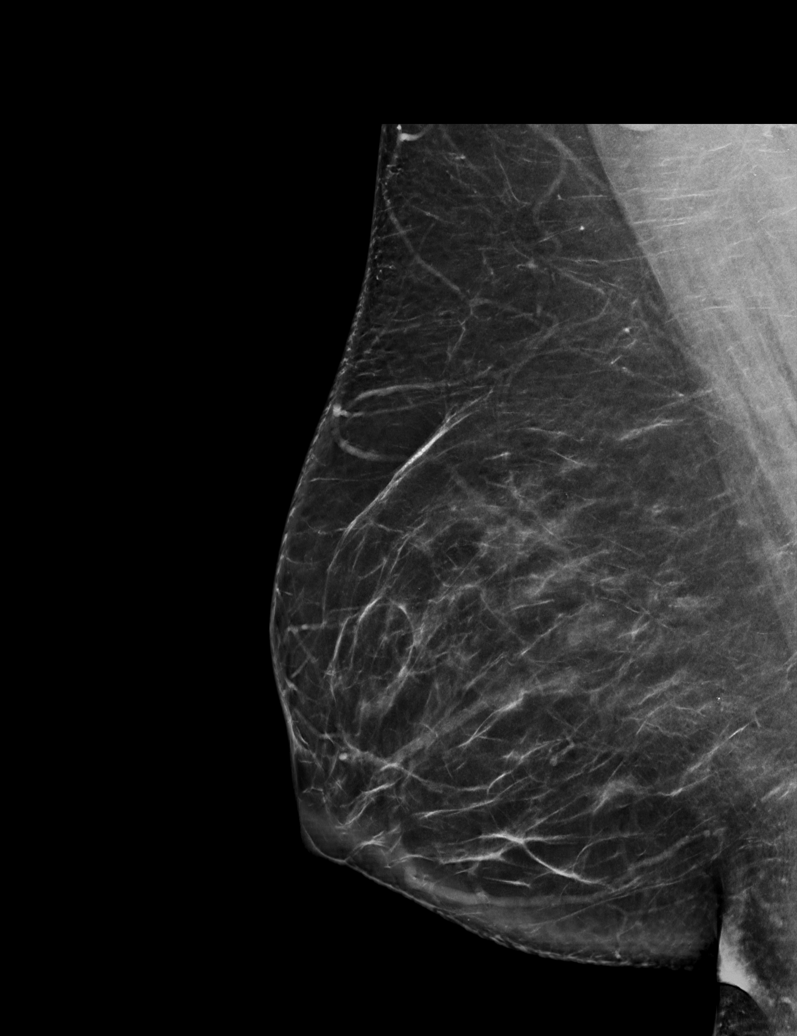

[R CC synth-2D (2 of 2)]
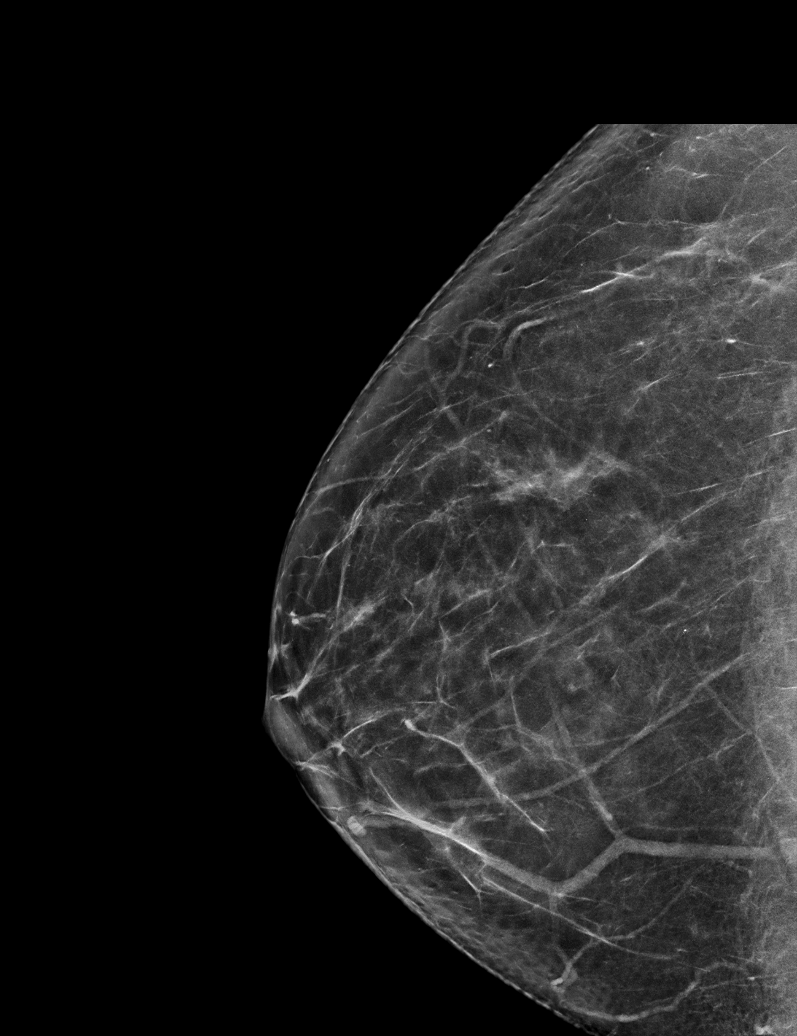

[R CC tomo · tomo slice 33/66.0]
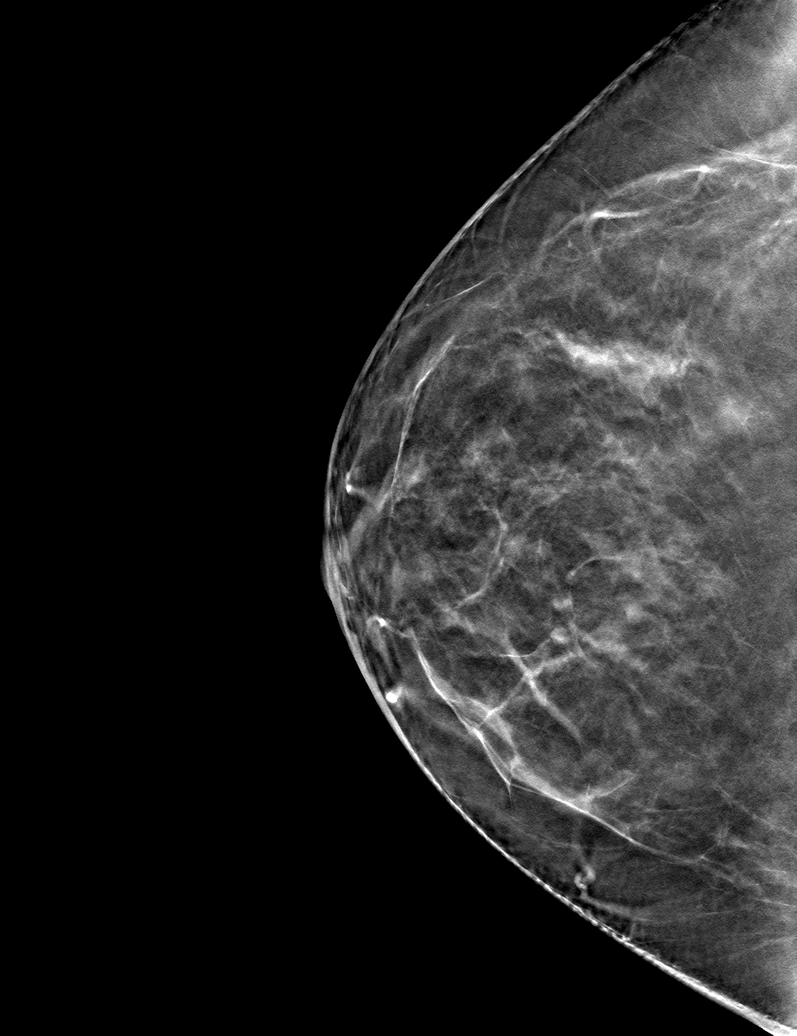

[6 of 30 positions shown; findings below may reference images not displayed]

ACR Breast Density Category b: There are scattered areas of
fibroglandular density.
FINDINGS: There are no findings suspicious for malignancy.
IMPRESSION: No mammographic evidence of malignancy. A result letter of this
screening mammogram will be mailed directly to the patient.

RECOMMENDATION:
Screening mammogram in one year. (Code:51-O-LD2)

BI-RADS CATEGORY  1: Negative.

## 2021-11-08 DIAGNOSIS — K7460 Unspecified cirrhosis of liver: Secondary | ICD-10-CM | POA: Diagnosis not present

## 2021-11-08 DIAGNOSIS — K7581 Nonalcoholic steatohepatitis (NASH): Secondary | ICD-10-CM | POA: Diagnosis not present

## 2021-11-09 ENCOUNTER — Other Ambulatory Visit: Payer: Self-pay | Admitting: Nurse Practitioner

## 2021-11-09 DIAGNOSIS — K7460 Unspecified cirrhosis of liver: Secondary | ICD-10-CM

## 2021-11-24 ENCOUNTER — Ambulatory Visit
Admission: RE | Admit: 2021-11-24 | Discharge: 2021-11-24 | Disposition: A | Discharge status: Home / Self Care | Payer: Federal, State, Local not specified - PPO | Source: Ambulatory Visit | Attending: Nurse Practitioner | Admitting: Nurse Practitioner

## 2021-11-24 DIAGNOSIS — K7460 Unspecified cirrhosis of liver: Secondary | ICD-10-CM | POA: Diagnosis not present

## 2021-11-24 DIAGNOSIS — Z9049 Acquired absence of other specified parts of digestive tract: Secondary | ICD-10-CM | POA: Diagnosis not present

## 2021-11-30 DIAGNOSIS — K080 Exfoliation of teeth due to systemic causes: Secondary | ICD-10-CM | POA: Diagnosis not present

## 2021-12-08 DIAGNOSIS — E782 Mixed hyperlipidemia: Secondary | ICD-10-CM | POA: Diagnosis not present

## 2021-12-08 DIAGNOSIS — E1169 Type 2 diabetes mellitus with other specified complication: Secondary | ICD-10-CM | POA: Diagnosis not present

## 2022-01-10 DIAGNOSIS — M9902 Segmental and somatic dysfunction of thoracic region: Secondary | ICD-10-CM | POA: Diagnosis not present

## 2022-01-10 DIAGNOSIS — M546 Pain in thoracic spine: Secondary | ICD-10-CM | POA: Diagnosis not present

## 2022-01-10 DIAGNOSIS — M5459 Other low back pain: Secondary | ICD-10-CM | POA: Diagnosis not present

## 2022-01-10 DIAGNOSIS — M9903 Segmental and somatic dysfunction of lumbar region: Secondary | ICD-10-CM | POA: Diagnosis not present

## 2022-01-11 DIAGNOSIS — M9901 Segmental and somatic dysfunction of cervical region: Secondary | ICD-10-CM | POA: Diagnosis not present

## 2022-01-11 DIAGNOSIS — M9902 Segmental and somatic dysfunction of thoracic region: Secondary | ICD-10-CM | POA: Diagnosis not present

## 2022-01-11 DIAGNOSIS — M9903 Segmental and somatic dysfunction of lumbar region: Secondary | ICD-10-CM | POA: Diagnosis not present

## 2022-01-11 DIAGNOSIS — M5413 Radiculopathy, cervicothoracic region: Secondary | ICD-10-CM | POA: Diagnosis not present

## 2022-01-11 DIAGNOSIS — M5459 Other low back pain: Secondary | ICD-10-CM | POA: Diagnosis not present

## 2022-01-11 DIAGNOSIS — M5412 Radiculopathy, cervical region: Secondary | ICD-10-CM | POA: Diagnosis not present

## 2022-01-19 DIAGNOSIS — M5459 Other low back pain: Secondary | ICD-10-CM | POA: Diagnosis not present

## 2022-01-19 DIAGNOSIS — M546 Pain in thoracic spine: Secondary | ICD-10-CM | POA: Diagnosis not present

## 2022-01-19 DIAGNOSIS — M9903 Segmental and somatic dysfunction of lumbar region: Secondary | ICD-10-CM | POA: Diagnosis not present

## 2022-01-19 DIAGNOSIS — M9902 Segmental and somatic dysfunction of thoracic region: Secondary | ICD-10-CM | POA: Diagnosis not present

## 2022-01-26 DIAGNOSIS — M546 Pain in thoracic spine: Secondary | ICD-10-CM | POA: Diagnosis not present

## 2022-01-26 DIAGNOSIS — M9902 Segmental and somatic dysfunction of thoracic region: Secondary | ICD-10-CM | POA: Diagnosis not present

## 2022-01-26 DIAGNOSIS — M5459 Other low back pain: Secondary | ICD-10-CM | POA: Diagnosis not present

## 2022-01-26 DIAGNOSIS — M9903 Segmental and somatic dysfunction of lumbar region: Secondary | ICD-10-CM | POA: Diagnosis not present

## 2022-02-02 DIAGNOSIS — M25512 Pain in left shoulder: Secondary | ICD-10-CM | POA: Diagnosis not present

## 2022-02-02 DIAGNOSIS — M5412 Radiculopathy, cervical region: Secondary | ICD-10-CM | POA: Diagnosis not present

## 2022-02-02 DIAGNOSIS — M9901 Segmental and somatic dysfunction of cervical region: Secondary | ICD-10-CM | POA: Diagnosis not present

## 2022-02-02 DIAGNOSIS — M9902 Segmental and somatic dysfunction of thoracic region: Secondary | ICD-10-CM | POA: Diagnosis not present

## 2022-02-02 DIAGNOSIS — M546 Pain in thoracic spine: Secondary | ICD-10-CM | POA: Diagnosis not present

## 2022-02-03 DIAGNOSIS — M9901 Segmental and somatic dysfunction of cervical region: Secondary | ICD-10-CM | POA: Diagnosis not present

## 2022-02-03 DIAGNOSIS — M546 Pain in thoracic spine: Secondary | ICD-10-CM | POA: Diagnosis not present

## 2022-02-03 DIAGNOSIS — M9902 Segmental and somatic dysfunction of thoracic region: Secondary | ICD-10-CM | POA: Diagnosis not present

## 2022-02-03 DIAGNOSIS — M5412 Radiculopathy, cervical region: Secondary | ICD-10-CM | POA: Diagnosis not present

## 2022-02-03 DIAGNOSIS — M25512 Pain in left shoulder: Secondary | ICD-10-CM | POA: Diagnosis not present

## 2022-02-08 DIAGNOSIS — M546 Pain in thoracic spine: Secondary | ICD-10-CM | POA: Diagnosis not present

## 2022-02-08 DIAGNOSIS — M5412 Radiculopathy, cervical region: Secondary | ICD-10-CM | POA: Diagnosis not present

## 2022-02-08 DIAGNOSIS — M9901 Segmental and somatic dysfunction of cervical region: Secondary | ICD-10-CM | POA: Diagnosis not present

## 2022-02-08 DIAGNOSIS — M9902 Segmental and somatic dysfunction of thoracic region: Secondary | ICD-10-CM | POA: Diagnosis not present

## 2022-02-21 DIAGNOSIS — M546 Pain in thoracic spine: Secondary | ICD-10-CM | POA: Diagnosis not present

## 2022-02-21 DIAGNOSIS — M9901 Segmental and somatic dysfunction of cervical region: Secondary | ICD-10-CM | POA: Diagnosis not present

## 2022-02-21 DIAGNOSIS — M542 Cervicalgia: Secondary | ICD-10-CM | POA: Diagnosis not present

## 2022-02-21 DIAGNOSIS — M9902 Segmental and somatic dysfunction of thoracic region: Secondary | ICD-10-CM | POA: Diagnosis not present

## 2022-03-08 DIAGNOSIS — M546 Pain in thoracic spine: Secondary | ICD-10-CM | POA: Diagnosis not present

## 2022-03-08 DIAGNOSIS — M9902 Segmental and somatic dysfunction of thoracic region: Secondary | ICD-10-CM | POA: Diagnosis not present

## 2022-03-08 DIAGNOSIS — M542 Cervicalgia: Secondary | ICD-10-CM | POA: Diagnosis not present

## 2022-03-08 DIAGNOSIS — M9901 Segmental and somatic dysfunction of cervical region: Secondary | ICD-10-CM | POA: Diagnosis not present

## 2022-04-05 DIAGNOSIS — M9903 Segmental and somatic dysfunction of lumbar region: Secondary | ICD-10-CM | POA: Diagnosis not present

## 2022-04-05 DIAGNOSIS — M5459 Other low back pain: Secondary | ICD-10-CM | POA: Diagnosis not present

## 2022-04-05 DIAGNOSIS — M9901 Segmental and somatic dysfunction of cervical region: Secondary | ICD-10-CM | POA: Diagnosis not present

## 2022-04-05 DIAGNOSIS — M5412 Radiculopathy, cervical region: Secondary | ICD-10-CM | POA: Diagnosis not present

## 2022-05-10 DIAGNOSIS — M5459 Other low back pain: Secondary | ICD-10-CM | POA: Diagnosis not present

## 2022-05-10 DIAGNOSIS — M461 Sacroiliitis, not elsewhere classified: Secondary | ICD-10-CM | POA: Diagnosis not present

## 2022-05-10 DIAGNOSIS — M9904 Segmental and somatic dysfunction of sacral region: Secondary | ICD-10-CM | POA: Diagnosis not present

## 2022-05-10 DIAGNOSIS — M9903 Segmental and somatic dysfunction of lumbar region: Secondary | ICD-10-CM | POA: Diagnosis not present

## 2022-05-11 DIAGNOSIS — E559 Vitamin D deficiency, unspecified: Secondary | ICD-10-CM | POA: Diagnosis not present

## 2022-05-11 DIAGNOSIS — K7460 Unspecified cirrhosis of liver: Secondary | ICD-10-CM | POA: Diagnosis not present

## 2022-05-11 DIAGNOSIS — E785 Hyperlipidemia, unspecified: Secondary | ICD-10-CM | POA: Diagnosis not present

## 2022-05-11 DIAGNOSIS — E119 Type 2 diabetes mellitus without complications: Secondary | ICD-10-CM | POA: Diagnosis not present

## 2022-05-11 DIAGNOSIS — K7581 Nonalcoholic steatohepatitis (NASH): Secondary | ICD-10-CM | POA: Diagnosis not present

## 2022-05-12 ENCOUNTER — Other Ambulatory Visit: Payer: Self-pay | Admitting: Nurse Practitioner

## 2022-05-12 ENCOUNTER — Other Ambulatory Visit: Payer: Self-pay

## 2022-05-12 DIAGNOSIS — K7460 Unspecified cirrhosis of liver: Secondary | ICD-10-CM

## 2022-05-17 DIAGNOSIS — R109 Unspecified abdominal pain: Secondary | ICD-10-CM | POA: Diagnosis not present

## 2022-05-17 DIAGNOSIS — Z20822 Contact with and (suspected) exposure to covid-19: Secondary | ICD-10-CM | POA: Diagnosis not present

## 2022-05-30 ENCOUNTER — Ambulatory Visit
Admission: RE | Admit: 2022-05-30 | Discharge: 2022-05-30 | Disposition: A | Discharge status: Home / Self Care | Payer: Federal, State, Local not specified - PPO | Source: Ambulatory Visit | Attending: Nurse Practitioner | Admitting: Nurse Practitioner

## 2022-05-30 DIAGNOSIS — K7460 Unspecified cirrhosis of liver: Secondary | ICD-10-CM | POA: Diagnosis not present

## 2022-05-30 DIAGNOSIS — K7581 Nonalcoholic steatohepatitis (NASH): Secondary | ICD-10-CM

## 2022-06-13 DIAGNOSIS — Z0000 Encounter for general adult medical examination without abnormal findings: Secondary | ICD-10-CM | POA: Diagnosis not present

## 2022-06-13 DIAGNOSIS — E1169 Type 2 diabetes mellitus with other specified complication: Secondary | ICD-10-CM | POA: Diagnosis not present

## 2022-06-13 DIAGNOSIS — E782 Mixed hyperlipidemia: Secondary | ICD-10-CM | POA: Diagnosis not present

## 2022-06-13 DIAGNOSIS — Z23 Encounter for immunization: Secondary | ICD-10-CM | POA: Diagnosis not present

## 2022-06-13 DIAGNOSIS — E559 Vitamin D deficiency, unspecified: Secondary | ICD-10-CM | POA: Diagnosis not present

## 2022-06-21 DIAGNOSIS — M546 Pain in thoracic spine: Secondary | ICD-10-CM | POA: Diagnosis not present

## 2022-06-21 DIAGNOSIS — M9902 Segmental and somatic dysfunction of thoracic region: Secondary | ICD-10-CM | POA: Diagnosis not present

## 2022-06-21 DIAGNOSIS — M9904 Segmental and somatic dysfunction of sacral region: Secondary | ICD-10-CM | POA: Diagnosis not present

## 2022-06-21 DIAGNOSIS — M461 Sacroiliitis, not elsewhere classified: Secondary | ICD-10-CM | POA: Diagnosis not present

## 2022-07-06 DIAGNOSIS — M5413 Radiculopathy, cervicothoracic region: Secondary | ICD-10-CM | POA: Diagnosis not present

## 2022-07-06 DIAGNOSIS — M9902 Segmental and somatic dysfunction of thoracic region: Secondary | ICD-10-CM | POA: Diagnosis not present

## 2022-07-06 DIAGNOSIS — M5412 Radiculopathy, cervical region: Secondary | ICD-10-CM | POA: Diagnosis not present

## 2022-07-06 DIAGNOSIS — M9901 Segmental and somatic dysfunction of cervical region: Secondary | ICD-10-CM | POA: Diagnosis not present

## 2022-07-20 DIAGNOSIS — M461 Sacroiliitis, not elsewhere classified: Secondary | ICD-10-CM | POA: Diagnosis not present

## 2022-07-20 DIAGNOSIS — R109 Unspecified abdominal pain: Secondary | ICD-10-CM | POA: Diagnosis not present

## 2022-07-20 DIAGNOSIS — M9904 Segmental and somatic dysfunction of sacral region: Secondary | ICD-10-CM | POA: Diagnosis not present

## 2022-07-20 DIAGNOSIS — M5459 Other low back pain: Secondary | ICD-10-CM | POA: Diagnosis not present

## 2022-07-20 DIAGNOSIS — M9903 Segmental and somatic dysfunction of lumbar region: Secondary | ICD-10-CM | POA: Diagnosis not present

## 2022-07-20 DIAGNOSIS — N200 Calculus of kidney: Secondary | ICD-10-CM | POA: Diagnosis not present

## 2022-08-29 DIAGNOSIS — M9903 Segmental and somatic dysfunction of lumbar region: Secondary | ICD-10-CM | POA: Diagnosis not present

## 2022-08-29 DIAGNOSIS — M9902 Segmental and somatic dysfunction of thoracic region: Secondary | ICD-10-CM | POA: Diagnosis not present

## 2022-08-29 DIAGNOSIS — M5416 Radiculopathy, lumbar region: Secondary | ICD-10-CM | POA: Diagnosis not present

## 2022-08-29 DIAGNOSIS — M5415 Radiculopathy, thoracolumbar region: Secondary | ICD-10-CM | POA: Diagnosis not present

## 2022-09-13 DIAGNOSIS — Z23 Encounter for immunization: Secondary | ICD-10-CM | POA: Diagnosis not present

## 2022-09-16 ENCOUNTER — Other Ambulatory Visit: Payer: Self-pay | Admitting: Family Medicine

## 2022-09-16 DIAGNOSIS — Z1231 Encounter for screening mammogram for malignant neoplasm of breast: Secondary | ICD-10-CM

## 2022-09-20 DIAGNOSIS — Z01419 Encounter for gynecological examination (general) (routine) without abnormal findings: Secondary | ICD-10-CM | POA: Diagnosis not present

## 2022-09-28 DIAGNOSIS — M9902 Segmental and somatic dysfunction of thoracic region: Secondary | ICD-10-CM | POA: Diagnosis not present

## 2022-09-28 DIAGNOSIS — M5416 Radiculopathy, lumbar region: Secondary | ICD-10-CM | POA: Diagnosis not present

## 2022-09-28 DIAGNOSIS — M9903 Segmental and somatic dysfunction of lumbar region: Secondary | ICD-10-CM | POA: Diagnosis not present

## 2022-10-19 ENCOUNTER — Ambulatory Visit
Admission: RE | Admit: 2022-10-19 | Discharge: 2022-10-19 | Disposition: A | Discharge status: Home / Self Care | Payer: Federal, State, Local not specified - PPO | Source: Ambulatory Visit | Attending: Family Medicine | Admitting: Family Medicine

## 2022-10-19 DIAGNOSIS — Z1231 Encounter for screening mammogram for malignant neoplasm of breast: Secondary | ICD-10-CM

## 2022-10-24 DIAGNOSIS — M9902 Segmental and somatic dysfunction of thoracic region: Secondary | ICD-10-CM | POA: Diagnosis not present

## 2022-10-24 DIAGNOSIS — M9901 Segmental and somatic dysfunction of cervical region: Secondary | ICD-10-CM | POA: Diagnosis not present

## 2022-10-24 DIAGNOSIS — M546 Pain in thoracic spine: Secondary | ICD-10-CM | POA: Diagnosis not present

## 2022-10-24 DIAGNOSIS — M542 Cervicalgia: Secondary | ICD-10-CM | POA: Diagnosis not present

## 2022-11-22 ENCOUNTER — Other Ambulatory Visit: Payer: Self-pay | Admitting: Nurse Practitioner

## 2022-11-22 DIAGNOSIS — K7460 Unspecified cirrhosis of liver: Secondary | ICD-10-CM

## 2022-11-22 DIAGNOSIS — K7581 Nonalcoholic steatohepatitis (NASH): Secondary | ICD-10-CM | POA: Diagnosis not present

## 2022-11-28 ENCOUNTER — Other Ambulatory Visit: Payer: Federal, State, Local not specified - PPO

## 2022-11-30 ENCOUNTER — Ambulatory Visit
Admission: RE | Admit: 2022-11-30 | Discharge: 2022-11-30 | Disposition: A | Discharge status: Home / Self Care | Payer: Federal, State, Local not specified - PPO | Source: Ambulatory Visit | Attending: Nurse Practitioner | Admitting: Nurse Practitioner

## 2022-11-30 DIAGNOSIS — K7460 Unspecified cirrhosis of liver: Secondary | ICD-10-CM | POA: Diagnosis not present

## 2022-12-06 DIAGNOSIS — M9902 Segmental and somatic dysfunction of thoracic region: Secondary | ICD-10-CM | POA: Diagnosis not present

## 2022-12-06 DIAGNOSIS — M546 Pain in thoracic spine: Secondary | ICD-10-CM | POA: Diagnosis not present

## 2022-12-06 DIAGNOSIS — M542 Cervicalgia: Secondary | ICD-10-CM | POA: Diagnosis not present

## 2022-12-06 DIAGNOSIS — M9901 Segmental and somatic dysfunction of cervical region: Secondary | ICD-10-CM | POA: Diagnosis not present

## 2022-12-26 DIAGNOSIS — M546 Pain in thoracic spine: Secondary | ICD-10-CM | POA: Diagnosis not present

## 2022-12-26 DIAGNOSIS — M9901 Segmental and somatic dysfunction of cervical region: Secondary | ICD-10-CM | POA: Diagnosis not present

## 2022-12-26 DIAGNOSIS — M542 Cervicalgia: Secondary | ICD-10-CM | POA: Diagnosis not present

## 2022-12-26 DIAGNOSIS — M9902 Segmental and somatic dysfunction of thoracic region: Secondary | ICD-10-CM | POA: Diagnosis not present

## 2023-06-21 DIAGNOSIS — K7460 Unspecified cirrhosis of liver: Secondary | ICD-10-CM | POA: Diagnosis not present

## 2023-06-21 DIAGNOSIS — E782 Mixed hyperlipidemia: Secondary | ICD-10-CM | POA: Diagnosis not present

## 2023-06-21 DIAGNOSIS — E559 Vitamin D deficiency, unspecified: Secondary | ICD-10-CM | POA: Diagnosis not present

## 2023-06-21 DIAGNOSIS — E1169 Type 2 diabetes mellitus with other specified complication: Secondary | ICD-10-CM | POA: Diagnosis not present

## 2023-06-21 DIAGNOSIS — Z0000 Encounter for general adult medical examination without abnormal findings: Secondary | ICD-10-CM | POA: Diagnosis not present

## 2023-07-17 DIAGNOSIS — M9902 Segmental and somatic dysfunction of thoracic region: Secondary | ICD-10-CM | POA: Diagnosis not present

## 2023-07-17 DIAGNOSIS — M9904 Segmental and somatic dysfunction of sacral region: Secondary | ICD-10-CM | POA: Diagnosis not present

## 2023-07-17 DIAGNOSIS — M546 Pain in thoracic spine: Secondary | ICD-10-CM | POA: Diagnosis not present

## 2023-07-17 DIAGNOSIS — M461 Sacroiliitis, not elsewhere classified: Secondary | ICD-10-CM | POA: Diagnosis not present

## 2023-08-18 ENCOUNTER — Encounter (HOSPITAL_COMMUNITY): Payer: Self-pay

## 2023-08-18 ENCOUNTER — Emergency Department (HOSPITAL_COMMUNITY)

## 2023-08-18 ENCOUNTER — Emergency Department (HOSPITAL_COMMUNITY)
Admission: EM | Admit: 2023-08-18 | Discharge: 2023-08-18 | Disposition: A | Discharge status: Home / Self Care | Attending: Emergency Medicine | Admitting: Emergency Medicine

## 2023-08-18 ENCOUNTER — Other Ambulatory Visit: Payer: Self-pay

## 2023-08-18 DIAGNOSIS — Y92410 Unspecified street and highway as the place of occurrence of the external cause: Secondary | ICD-10-CM | POA: Diagnosis not present

## 2023-08-18 DIAGNOSIS — M79644 Pain in right finger(s): Secondary | ICD-10-CM | POA: Diagnosis not present

## 2023-08-18 DIAGNOSIS — R519 Headache, unspecified: Secondary | ICD-10-CM | POA: Insufficient documentation

## 2023-08-18 DIAGNOSIS — M25531 Pain in right wrist: Secondary | ICD-10-CM | POA: Diagnosis not present

## 2023-08-18 DIAGNOSIS — S199XXA Unspecified injury of neck, initial encounter: Secondary | ICD-10-CM | POA: Diagnosis not present

## 2023-08-18 DIAGNOSIS — H9313 Tinnitus, bilateral: Secondary | ICD-10-CM | POA: Insufficient documentation

## 2023-08-18 DIAGNOSIS — M79603 Pain in arm, unspecified: Secondary | ICD-10-CM | POA: Diagnosis not present

## 2023-08-18 DIAGNOSIS — S0990XA Unspecified injury of head, initial encounter: Secondary | ICD-10-CM | POA: Diagnosis not present

## 2023-08-18 MED ORDER — OXYCODONE HCL 5 MG PO TABS
5.0000 mg | ORAL_TABLET | Freq: Once | ORAL | Status: AC
  Administered 2023-08-18: 5 mg via ORAL
  Filled 2023-08-18: qty 1

## 2023-08-18 MED ORDER — ACETAMINOPHEN 500 MG PO TABS
1000.0000 mg | ORAL_TABLET | Freq: Once | ORAL | Status: AC
  Administered 2023-08-18: 1000 mg via ORAL
  Filled 2023-08-18: qty 2

## 2023-08-18 NOTE — Progress Notes (Signed)
 Orthopedic Tech Progress Note Patient Details:  Shari Olson 06/30/1970 [id number]  Ortho Devices Type of Ortho Device: Thumb velcro splint Ortho Device/Splint Location: RUE Ortho Device/Splint Interventions: Application, Adjustment, Ordered   Post Interventions Patient Tolerated: Well  Virlan Kempker A Nastassia Bazaldua 08/18/2023, 2:53 PM

## 2023-08-18 NOTE — ED Notes (Signed)
 Patient transported to CT
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
## 2023-08-18 NOTE — Discharge Instructions (Signed)
 Your images here look okay.  Sometimes you can have a injury to the thumb that is not seen on x-ray.  Please follow-up with a hand surgeon in the office.  Take 4 over the counter ibuprofen  tablets 3 times a day or 2 over-the-counter naproxen tablets twice a day for pain. Also take tylenol  1000mg (2 extra strength) four times a day.

## 2023-08-18 NOTE — Progress Notes (Addendum)
 Chaplain checked in with Shari Olson at the request of her mother (also a pt currently). Shari Olson reports she is doing okay, though became very tearful. She stated she was just glad to know her mother is alright. Her family has been contacted. No further needs at this time, however chaplain remains available.
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
## 2023-08-21 ENCOUNTER — Emergency Department (HOSPITAL_COMMUNITY)

## 2023-08-21 ENCOUNTER — Other Ambulatory Visit: Payer: Self-pay

## 2023-08-21 ENCOUNTER — Encounter (HOSPITAL_COMMUNITY): Payer: Self-pay

## 2023-08-21 ENCOUNTER — Emergency Department (HOSPITAL_COMMUNITY)
Admission: EM | Admit: 2023-08-21 | Discharge: 2023-08-21 | Disposition: A | Discharge status: Home / Self Care | Attending: Emergency Medicine | Admitting: Emergency Medicine

## 2023-08-21 DIAGNOSIS — N189 Chronic kidney disease, unspecified: Secondary | ICD-10-CM | POA: Diagnosis not present

## 2023-08-21 DIAGNOSIS — M542 Cervicalgia: Secondary | ICD-10-CM | POA: Diagnosis not present

## 2023-08-21 DIAGNOSIS — S0990XA Unspecified injury of head, initial encounter: Secondary | ICD-10-CM

## 2023-08-21 DIAGNOSIS — M546 Pain in thoracic spine: Secondary | ICD-10-CM | POA: Insufficient documentation

## 2023-08-21 DIAGNOSIS — M79644 Pain in right finger(s): Secondary | ICD-10-CM | POA: Insufficient documentation

## 2023-08-21 DIAGNOSIS — S0990XD Unspecified injury of head, subsequent encounter: Secondary | ICD-10-CM | POA: Diagnosis not present

## 2023-08-21 DIAGNOSIS — M25511 Pain in right shoulder: Secondary | ICD-10-CM | POA: Diagnosis not present

## 2023-08-21 DIAGNOSIS — M25512 Pain in left shoulder: Secondary | ICD-10-CM | POA: Diagnosis not present

## 2023-08-21 DIAGNOSIS — S301XXD Contusion of abdominal wall, subsequent encounter: Secondary | ICD-10-CM | POA: Insufficient documentation

## 2023-08-21 DIAGNOSIS — R519 Headache, unspecified: Secondary | ICD-10-CM | POA: Diagnosis not present

## 2023-08-21 DIAGNOSIS — N200 Calculus of kidney: Secondary | ICD-10-CM | POA: Diagnosis not present

## 2023-08-21 DIAGNOSIS — S299XXA Unspecified injury of thorax, initial encounter: Secondary | ICD-10-CM | POA: Diagnosis not present

## 2023-08-21 DIAGNOSIS — Y92410 Unspecified street and highway as the place of occurrence of the external cause: Secondary | ICD-10-CM | POA: Insufficient documentation

## 2023-08-21 DIAGNOSIS — S199XXA Unspecified injury of neck, initial encounter: Secondary | ICD-10-CM | POA: Diagnosis not present

## 2023-08-21 DIAGNOSIS — S3993XA Unspecified injury of pelvis, initial encounter: Secondary | ICD-10-CM | POA: Diagnosis not present

## 2023-08-21 DIAGNOSIS — S3991XA Unspecified injury of abdomen, initial encounter: Secondary | ICD-10-CM | POA: Diagnosis not present

## 2023-08-21 LAB — CBC WITH DIFFERENTIAL/PLATELET
Abs Immature Granulocytes: 0.02 K/uL (ref 0.00–0.07)
Basophils Absolute: 0 K/uL (ref 0.0–0.1)
Basophils Relative: 0 %
Eosinophils Absolute: 0.1 K/uL (ref 0.0–0.5)
Eosinophils Relative: 1 %
HCT: 41 % (ref 36.0–46.0)
Hemoglobin: 13.1 g/dL (ref 12.0–15.0)
Immature Granulocytes: 0 %
Lymphocytes Relative: 26 %
Lymphs Abs: 2.4 K/uL (ref 0.7–4.0)
MCH: 28.2 pg (ref 26.0–34.0)
MCHC: 32 g/dL (ref 30.0–36.0)
MCV: 88.2 fL (ref 80.0–100.0)
Monocytes Absolute: 0.5 K/uL (ref 0.1–1.0)
Monocytes Relative: 6 %
Neutro Abs: 6.4 K/uL (ref 1.7–7.7)
Neutrophils Relative %: 67 %
Platelets: 354 K/uL (ref 150–400)
RBC: 4.65 MIL/uL (ref 3.87–5.11)
RDW: 14.5 % (ref 11.5–15.5)
WBC: 9.4 K/uL (ref 4.0–10.5)
nRBC: 0 % (ref 0.0–0.2)

## 2023-08-21 LAB — BASIC METABOLIC PANEL WITH GFR
Anion gap: 9 (ref 5–15)
BUN: 10 mg/dL (ref 6–20)
CO2: 22 mmol/L (ref 22–32)
Calcium: 9.4 mg/dL (ref 8.9–10.3)
Chloride: 108 mmol/L (ref 98–111)
Creatinine, Ser: 0.64 mg/dL (ref 0.44–1.00)
GFR, Estimated: >60 mL/min (ref >60)
Glucose, Bld: 98 mg/dL (ref 70–99)
Potassium: 3.8 mmol/L (ref 3.5–5.1)
Sodium: 139 mmol/L (ref 135–145)

## 2023-08-21 MED ORDER — FENTANYL CITRATE PF 50 MCG/ML IJ SOSY
25.0000 ug | PREFILLED_SYRINGE | Freq: Once | INTRAMUSCULAR | Status: AC
  Administered 2023-08-21: 25 ug via INTRAVENOUS
  Filled 2023-08-21: qty 1

## 2023-08-21 MED ORDER — IOHEXOL 350 MG/ML SOLN
75.0000 mL | Freq: Once | INTRAVENOUS | Status: AC | PRN
  Administered 2023-08-21: 75 mL via INTRAVENOUS
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
## 2023-08-21 NOTE — ED Notes (Signed)
 PT D/C'D AFTER INSTRUCTIONS REVIEWED. PT VERBALIZED UNDERSTANDING. NAD REPORTED OR NOTED AT THIS TIME.
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
## 2023-08-23 DIAGNOSIS — M542 Cervicalgia: Secondary | ICD-10-CM | POA: Diagnosis not present

## 2023-08-23 DIAGNOSIS — M546 Pain in thoracic spine: Secondary | ICD-10-CM | POA: Diagnosis not present

## 2023-08-23 DIAGNOSIS — E1169 Type 2 diabetes mellitus with other specified complication: Secondary | ICD-10-CM | POA: Diagnosis not present

## 2023-08-23 DIAGNOSIS — M79644 Pain in right finger(s): Secondary | ICD-10-CM | POA: Diagnosis not present

## 2023-09-27 DIAGNOSIS — E1169 Type 2 diabetes mellitus with other specified complication: Secondary | ICD-10-CM | POA: Diagnosis not present

## 2023-09-27 DIAGNOSIS — E782 Mixed hyperlipidemia: Secondary | ICD-10-CM | POA: Diagnosis not present

## 2023-09-27 DIAGNOSIS — E559 Vitamin D deficiency, unspecified: Secondary | ICD-10-CM | POA: Diagnosis not present

## 2023-09-27 DIAGNOSIS — K7460 Unspecified cirrhosis of liver: Secondary | ICD-10-CM | POA: Diagnosis not present

## 2023-09-28 DIAGNOSIS — M654 Radial styloid tenosynovitis [de Quervain]: Secondary | ICD-10-CM | POA: Diagnosis not present

## 2023-10-04 ENCOUNTER — Other Ambulatory Visit: Payer: Self-pay | Admitting: Family Medicine

## 2023-10-04 DIAGNOSIS — Z1231 Encounter for screening mammogram for malignant neoplasm of breast: Secondary | ICD-10-CM

## 2023-10-09 ENCOUNTER — Other Ambulatory Visit (HOSPITAL_COMMUNITY)
Admission: RE | Admit: 2023-10-09 | Discharge: 2023-10-09 | Disposition: A | Discharge status: Home / Self Care | Source: Ambulatory Visit | Attending: Obstetrics and Gynecology | Admitting: Obstetrics and Gynecology

## 2023-10-09 ENCOUNTER — Other Ambulatory Visit: Payer: Self-pay | Admitting: Obstetrics and Gynecology

## 2023-10-09 DIAGNOSIS — Z1151 Encounter for screening for human papillomavirus (HPV): Secondary | ICD-10-CM | POA: Insufficient documentation

## 2023-10-09 DIAGNOSIS — Z01419 Encounter for gynecological examination (general) (routine) without abnormal findings: Secondary | ICD-10-CM | POA: Insufficient documentation

## 2023-10-10 DIAGNOSIS — M549 Dorsalgia, unspecified: Secondary | ICD-10-CM | POA: Diagnosis not present

## 2023-10-10 LAB — CYTOLOGY - PAP
Comment: NEGATIVE
Diagnosis: NEGATIVE
High risk HPV: NEGATIVE

## 2023-10-24 DIAGNOSIS — M549 Dorsalgia, unspecified: Secondary | ICD-10-CM | POA: Diagnosis not present

## 2023-10-27 ENCOUNTER — Ambulatory Visit
Admission: RE | Admit: 2023-10-27 | Discharge: 2023-10-27 | Disposition: A | Discharge status: Home / Self Care | Source: Ambulatory Visit | Attending: Family Medicine | Admitting: Family Medicine

## 2023-10-27 DIAGNOSIS — Z1231 Encounter for screening mammogram for malignant neoplasm of breast: Secondary | ICD-10-CM

## 2023-11-01 DIAGNOSIS — M549 Dorsalgia, unspecified: Secondary | ICD-10-CM | POA: Diagnosis not present

## 2023-11-09 DIAGNOSIS — M778 Other enthesopathies, not elsewhere classified: Secondary | ICD-10-CM | POA: Diagnosis not present

## 2023-11-10 DIAGNOSIS — M549 Dorsalgia, unspecified: Secondary | ICD-10-CM | POA: Diagnosis not present

## 2023-11-16 DIAGNOSIS — M549 Dorsalgia, unspecified: Secondary | ICD-10-CM | POA: Diagnosis not present

## 2023-11-22 DIAGNOSIS — M549 Dorsalgia, unspecified: Secondary | ICD-10-CM | POA: Diagnosis not present

## 2023-11-28 DIAGNOSIS — M549 Dorsalgia, unspecified: Secondary | ICD-10-CM | POA: Diagnosis not present

## 2023-12-01 DIAGNOSIS — Z860101 Personal history of adenomatous and serrated colon polyps: Secondary | ICD-10-CM | POA: Diagnosis not present

## 2023-12-01 DIAGNOSIS — K648 Other hemorrhoids: Secondary | ICD-10-CM | POA: Diagnosis not present

## 2023-12-01 DIAGNOSIS — K5730 Diverticulosis of large intestine without perforation or abscess without bleeding: Secondary | ICD-10-CM | POA: Diagnosis not present

## 2023-12-01 DIAGNOSIS — Z09 Encounter for follow-up examination after completed treatment for conditions other than malignant neoplasm: Secondary | ICD-10-CM | POA: Diagnosis not present

## 2023-12-05 DIAGNOSIS — M549 Dorsalgia, unspecified: Secondary | ICD-10-CM | POA: Diagnosis not present

## 2023-12-08 DIAGNOSIS — M778 Other enthesopathies, not elsewhere classified: Secondary | ICD-10-CM | POA: Diagnosis not present

## 2023-12-12 DIAGNOSIS — M549 Dorsalgia, unspecified: Secondary | ICD-10-CM | POA: Diagnosis not present

## 2023-12-12 DIAGNOSIS — M542 Cervicalgia: Secondary | ICD-10-CM | POA: Diagnosis not present

## 2023-12-28 DIAGNOSIS — M542 Cervicalgia: Secondary | ICD-10-CM | POA: Diagnosis not present

## 2023-12-28 DIAGNOSIS — M549 Dorsalgia, unspecified: Secondary | ICD-10-CM | POA: Diagnosis not present

## 2024-01-01 DIAGNOSIS — M549 Dorsalgia, unspecified: Secondary | ICD-10-CM | POA: Diagnosis not present

## 2024-01-01 DIAGNOSIS — K760 Fatty (change of) liver, not elsewhere classified: Secondary | ICD-10-CM | POA: Diagnosis not present

## 2024-01-01 DIAGNOSIS — K7581 Nonalcoholic steatohepatitis (NASH): Secondary | ICD-10-CM | POA: Diagnosis not present

## 2024-01-01 DIAGNOSIS — K7469 Other cirrhosis of liver: Secondary | ICD-10-CM | POA: Diagnosis not present

## 2024-01-01 DIAGNOSIS — E119 Type 2 diabetes mellitus without complications: Secondary | ICD-10-CM | POA: Diagnosis not present

## 2024-01-01 DIAGNOSIS — K7401 Hepatic fibrosis, early fibrosis: Secondary | ICD-10-CM | POA: Diagnosis not present

## 2024-01-01 DIAGNOSIS — M542 Cervicalgia: Secondary | ICD-10-CM | POA: Diagnosis not present

## 2024-01-02 ENCOUNTER — Other Ambulatory Visit (HOSPITAL_BASED_OUTPATIENT_CLINIC_OR_DEPARTMENT_OTHER): Payer: Self-pay | Admitting: Family Medicine

## 2024-01-02 ENCOUNTER — Other Ambulatory Visit (HOSPITAL_BASED_OUTPATIENT_CLINIC_OR_DEPARTMENT_OTHER): Payer: Self-pay | Admitting: Nurse Practitioner

## 2024-01-02 DIAGNOSIS — M542 Cervicalgia: Secondary | ICD-10-CM

## 2024-01-02 DIAGNOSIS — K7581 Nonalcoholic steatohepatitis (NASH): Secondary | ICD-10-CM

## 2024-01-09 DIAGNOSIS — M5459 Other low back pain: Secondary | ICD-10-CM | POA: Diagnosis not present

## 2024-01-09 DIAGNOSIS — M542 Cervicalgia: Secondary | ICD-10-CM | POA: Diagnosis not present

## 2024-01-11 ENCOUNTER — Ambulatory Visit (INDEPENDENT_AMBULATORY_CARE_PROVIDER_SITE_OTHER)
Admission: RE | Admit: 2024-01-11 | Discharge: 2024-01-11 | Disposition: A | Discharge status: Home / Self Care | Source: Ambulatory Visit | Attending: Nurse Practitioner | Admitting: Nurse Practitioner

## 2024-01-11 ENCOUNTER — Ambulatory Visit (INDEPENDENT_AMBULATORY_CARE_PROVIDER_SITE_OTHER)
Admission: RE | Admit: 2024-01-11 | Discharge: 2024-01-11 | Disposition: A | Discharge status: Home / Self Care | Source: Ambulatory Visit | Attending: Family Medicine | Admitting: Family Medicine

## 2024-01-11 ENCOUNTER — Inpatient Hospital Stay (INDEPENDENT_AMBULATORY_CARE_PROVIDER_SITE_OTHER): Admission: RE | Admit: 2024-01-11 | Discharge: 2024-01-11 | Attending: Family Medicine | Admitting: Family Medicine

## 2024-01-11 DIAGNOSIS — M542 Cervicalgia: Secondary | ICD-10-CM

## 2024-01-11 DIAGNOSIS — M47814 Spondylosis without myelopathy or radiculopathy, thoracic region: Secondary | ICD-10-CM | POA: Diagnosis not present

## 2024-01-11 DIAGNOSIS — Z9049 Acquired absence of other specified parts of digestive tract: Secondary | ICD-10-CM | POA: Diagnosis not present

## 2024-01-11 DIAGNOSIS — K7581 Nonalcoholic steatohepatitis (NASH): Secondary | ICD-10-CM | POA: Diagnosis not present

## 2024-01-11 DIAGNOSIS — M5021 Other cervical disc displacement,  high cervical region: Secondary | ICD-10-CM | POA: Diagnosis not present

## 2024-01-11 DIAGNOSIS — K7469 Other cirrhosis of liver: Secondary | ICD-10-CM

## 2024-01-11 DIAGNOSIS — K760 Fatty (change of) liver, not elsewhere classified: Secondary | ICD-10-CM | POA: Diagnosis not present
